# Patient Record
Sex: Female | Born: 1935 | Race: White | Hispanic: No | Smoking: Former smoker
Health system: Southern US, Community
[De-identification: ages and names within clinical notes are randomized; demographics above are authoritative.]

## PROBLEM LIST (undated history)

## (undated) DIAGNOSIS — I509 Heart failure, unspecified: Secondary | ICD-10-CM

## (undated) DIAGNOSIS — G459 Transient cerebral ischemic attack, unspecified: Secondary | ICD-10-CM

## (undated) DIAGNOSIS — I10 Essential (primary) hypertension: Secondary | ICD-10-CM

## (undated) DIAGNOSIS — F419 Anxiety disorder, unspecified: Secondary | ICD-10-CM

## (undated) DIAGNOSIS — E079 Disorder of thyroid, unspecified: Secondary | ICD-10-CM

## (undated) DIAGNOSIS — I4891 Unspecified atrial fibrillation: Secondary | ICD-10-CM

## (undated) HISTORY — PX: ABDOMINAL HYSTERECTOMY: SHX81

## (undated) HISTORY — PX: BACK SURGERY: SHX140

## (undated) HISTORY — PX: EYE SURGERY: SHX253

---

## 2014-04-26 DIAGNOSIS — Z5181 Encounter for therapeutic drug level monitoring: Secondary | ICD-10-CM | POA: Insufficient documentation

## 2014-04-26 DIAGNOSIS — G479 Sleep disorder, unspecified: Secondary | ICD-10-CM | POA: Insufficient documentation

## 2014-04-26 DIAGNOSIS — I4891 Unspecified atrial fibrillation: Secondary | ICD-10-CM | POA: Insufficient documentation

## 2014-04-26 DIAGNOSIS — Z7901 Long term (current) use of anticoagulants: Secondary | ICD-10-CM | POA: Insufficient documentation

## 2014-04-26 DIAGNOSIS — Z8673 Personal history of transient ischemic attack (TIA), and cerebral infarction without residual deficits: Secondary | ICD-10-CM | POA: Insufficient documentation

## 2014-04-26 DIAGNOSIS — E034 Atrophy of thyroid (acquired): Secondary | ICD-10-CM | POA: Insufficient documentation

## 2014-04-26 DIAGNOSIS — I10 Essential (primary) hypertension: Secondary | ICD-10-CM | POA: Insufficient documentation

## 2017-05-09 ENCOUNTER — Emergency Department: Payer: Medicare Other

## 2017-05-09 ENCOUNTER — Emergency Department
Admission: EM | Admit: 2017-05-09 | Discharge: 2017-05-09 | Disposition: A | Discharge status: Skilled Nursing Facility | Payer: Medicare Other | Attending: Emergency Medicine | Admitting: Emergency Medicine

## 2017-05-09 ENCOUNTER — Other Ambulatory Visit: Payer: Self-pay

## 2017-05-09 DIAGNOSIS — N390 Urinary tract infection, site not specified: Secondary | ICD-10-CM

## 2017-05-09 DIAGNOSIS — M79605 Pain in left leg: Secondary | ICD-10-CM

## 2017-05-09 DIAGNOSIS — M79604 Pain in right leg: Secondary | ICD-10-CM | POA: Diagnosis not present

## 2017-05-09 DIAGNOSIS — L97929 Non-pressure chronic ulcer of unspecified part of left lower leg with unspecified severity: Secondary | ICD-10-CM | POA: Insufficient documentation

## 2017-05-09 DIAGNOSIS — L97919 Non-pressure chronic ulcer of unspecified part of right lower leg with unspecified severity: Secondary | ICD-10-CM | POA: Insufficient documentation

## 2017-05-09 LAB — URINALYSIS, COMPLETE (UACMP) WITH MICROSCOPIC
BILIRUBIN URINE: NEGATIVE
Bacteria, UA: NONE SEEN
Glucose, UA: NEGATIVE mg/dL
Hgb urine dipstick: NEGATIVE
Ketones, ur: NEGATIVE mg/dL
Nitrite: NEGATIVE
PROTEIN: NEGATIVE mg/dL
SPECIFIC GRAVITY, URINE: 1.019 (ref 1.005–1.030)
pH: 5 (ref 5.0–8.0)

## 2017-05-09 LAB — COMPREHENSIVE METABOLIC PANEL
ALBUMIN: 3.2 g/dL — AB (ref 3.5–5.0)
ALK PHOS: 63 U/L (ref 38–126)
ALT: 19 U/L (ref 14–54)
AST: 33 U/L (ref 15–41)
Anion gap: 6 (ref 5–15)
BILIRUBIN TOTAL: 0.5 mg/dL (ref 0.3–1.2)
BUN: 24 mg/dL — AB (ref 6–20)
CALCIUM: 8.4 mg/dL — AB (ref 8.9–10.3)
CO2: 27 mmol/L (ref 22–32)
Chloride: 107 mmol/L (ref 101–111)
Creatinine, Ser: 0.96 mg/dL (ref 0.44–1.00)
GFR calc Af Amer: >60 mL/min (ref >60)
GFR calc non Af Amer: 54 mL/min — ABNORMAL LOW (ref >60)
GLUCOSE: 94 mg/dL (ref 65–99)
POTASSIUM: 3.2 mmol/L — AB (ref 3.5–5.1)
Sodium: 140 mmol/L (ref 135–145)
TOTAL PROTEIN: 6.4 g/dL — AB (ref 6.5–8.1)

## 2017-05-09 LAB — CBC WITH DIFFERENTIAL/PLATELET
BASOS ABS: 0 10*3/uL (ref 0–0.1)
BASOS PCT: 1 %
Eosinophils Absolute: 0.2 10*3/uL (ref 0–0.7)
Eosinophils Relative: 4 %
HEMATOCRIT: 30.9 % — AB (ref 35.0–47.0)
Hemoglobin: 10.5 g/dL — ABNORMAL LOW (ref 12.0–16.0)
Lymphocytes Relative: 31 %
Lymphs Abs: 1.6 10*3/uL (ref 1.0–3.6)
MCH: 30 pg (ref 26.0–34.0)
MCHC: 34 g/dL (ref 32.0–36.0)
MCV: 88.3 fL (ref 80.0–100.0)
Monocytes Absolute: 0.6 10*3/uL (ref 0.2–0.9)
Monocytes Relative: 12 %
NEUTROS ABS: 2.6 10*3/uL (ref 1.4–6.5)
Neutrophils Relative %: 52 %
Platelets: 189 10*3/uL (ref 150–440)
RBC: 3.5 MIL/uL — ABNORMAL LOW (ref 3.80–5.20)
RDW: 17.1 % — ABNORMAL HIGH (ref 11.5–14.5)
WBC: 5.1 10*3/uL (ref 3.6–11.0)

## 2017-05-09 MED ORDER — ACETAMINOPHEN 500 MG PO TABS
ORAL_TABLET | ORAL | Status: AC
  Filled 2017-05-09: qty 1

## 2017-05-09 MED ORDER — BACITRACIN ZINC 500 UNIT/GM EX OINT
TOPICAL_OINTMENT | CUTANEOUS | Status: AC
  Filled 2017-05-09: qty 0.9

## 2017-05-09 MED ORDER — ACETAMINOPHEN 325 MG PO TABS
650.0000 mg | ORAL_TABLET | Freq: Once | ORAL | Status: AC
  Administered 2017-05-09: 650 mg via ORAL

## 2017-05-09 MED ORDER — KETOROLAC TROMETHAMINE 30 MG/ML IJ SOLN
INTRAMUSCULAR | Status: AC
  Administered 2017-05-09: 9.9 mg via INTRAVENOUS
  Filled 2017-05-09: qty 1

## 2017-05-09 MED ORDER — FOSFOMYCIN TROMETHAMINE 3 G PO PACK
3.0000 g | PACK | Freq: Once | ORAL | Status: AC
  Administered 2017-05-09: 3 g via ORAL
  Filled 2017-05-09: qty 3

## 2017-05-09 MED ORDER — ACETAMINOPHEN 325 MG PO TABS
ORAL_TABLET | ORAL | Status: AC
  Administered 2017-05-09: 650 mg via ORAL
  Filled 2017-05-09: qty 2

## 2017-05-09 MED ORDER — TRAMADOL HCL 50 MG PO TABS: 50.0000 mg | ORAL_TABLET | Freq: Four times a day (QID) | ORAL | 0 refills | Status: AC | PRN

## 2017-05-09 MED ORDER — TRAMADOL HCL 50 MG PO TABS
50.0000 mg | ORAL_TABLET | Freq: Once | ORAL | Status: AC
  Administered 2017-05-09: 50 mg via ORAL
  Filled 2017-05-09: qty 1

## 2017-05-09 MED ORDER — ACETAMINOPHEN 500 MG PO TABS
500.0000 mg | ORAL_TABLET | Freq: Once | ORAL | Status: AC
  Administered 2017-05-09: 500 mg via ORAL

## 2017-05-09 MED ORDER — KETOROLAC TROMETHAMINE 30 MG/ML IJ SOLN
10.0000 mg | Freq: Once | INTRAMUSCULAR | Status: AC
  Administered 2017-05-09: 9.9 mg via INTRAVENOUS

## 2017-05-09 MED ORDER — TRAMADOL HCL 50 MG PO TABS: 50.0000 mg | ORAL_TABLET | Freq: Once | ORAL | Status: DC

## 2017-05-09 NOTE — Discharge Instructions (Addendum)
1.  You may take Tylenol as needed for pain.  You may take Tramadol (#10) as needed for more severe pain. 2.  Return to the ER for worsening symptoms, persistent vomiting, difficulty breathing or other concerns.

## 2017-05-09 NOTE — ED Notes (Signed)
Pt unable to sign due to hx of dementia . PT back to [facility] via [facility]. PT in NAD, VSS at time of departure

## 2017-05-09 NOTE — Progress Notes (Signed)
   05/09/17 0531  Clinical Encounter Type  Visited With Patient  Visit Type Follow-up  Spiritual Encounters  Spiritual Needs Emotional   While on unit, chaplain encountered patient in hallway.  She spoke of her pain and waiting to see doctor.  Patient expressed thoughts regarding return to facility.

## 2017-05-09 NOTE — ED Notes (Signed)
Pt seen resting on stretcher, pt stops every person that walks by her.  Pt informed she would be going back to [facility] does not have transportation at this time, pt will go back by EMS.

## 2017-05-09 NOTE — ED Triage Notes (Signed)
Pt from [facility] with bilateral leg pain from ulcers to bilateral lower legs. Ulcers are currently wrapped. Pt denies fever. Pt states the pain from ulcers woke her from sleep tonight, pt states has had the ulcers for one month.

## 2017-05-09 NOTE — ED Notes (Signed)
Patient transported to Ultrasound 

## 2017-05-09 NOTE — ED Notes (Signed)
ED Provider at bedside. 

## 2017-05-09 NOTE — Progress Notes (Signed)
   05/09/17 0306  Clinical Encounter Type  Visited With Patient;Health care provider  Visit Type Initial  Referral From Nurse  Consult/Referral To Chaplain  Spiritual Encounters  Spiritual Needs Emotional;Prayer   Chaplain responded to page for patient support.  Patient nurse said that patient was lonely.  Chaplain engaged patient who shared thoughts and feelings about how fear has been a large part of her life for the last thirty years.  She believes it began after the deaths of her son and her husband.  Patient spoke of daughter with whom she had lived before moving to facility.  Patient reported that she had just been at the facility a day when she came to hospital.  Patient expressed concern several times that facility didn't know where she was and would worry.  Chaplain checked in with staff regarding patient concerns and relayed to patient that facility was aware of her location and that was one thing she didn't need to worry about.  Conversation around [employer] and attempting to manage it, discussion on how faith can play a role.  Chaplain and patient prayed together and chaplain let patient know about chaplain support available.

## 2017-05-09 NOTE — ED Notes (Signed)
Patient given a warm blanket at this time.  

## 2017-05-09 NOTE — ED Notes (Signed)
Bacitracin, xeroform and gauze dressings applied to bilateral lower extremity wounds per Dr. Dolores Frame.

## 2017-05-09 NOTE — ED Notes (Signed)
chaplin at bedside.   

## 2017-05-09 NOTE — ED Provider Notes (Signed)
[facility] Emergency Department Provider Note   ____________________________________________   First MD Initiated Contact with Patient 05/09/17 [phone]     (approximate)  I have reviewed the triage vital signs and the nursing notes.   HISTORY  Chief Complaint Leg Pain    HPI Lydia Buchanan is a 84 y.o. female brought to the ED from [facility] via EMS with a chief complaint of bilateral leg pain.  Patient has a history of venous stasis ulcers, currently being treated at the wound clinic.  She just moved to [facility] yesterday from living with her daughter.  Reports awakening with bilateral leg pain due to the ulcers.  States she has had the ulcers for the past month and has continuous pain.  She tried to stand tonight to use the restroom and noted exacerbation of pain.  Denies associated fever, chills, chest pain, shortness of breath, abdominal pain, nausea or vomiting.  Denies recent travel or trauma.  Past medical history Leg ulcers   There are no active problems to display for this patient.   Prior to Admission medications   Not on File    Allergies Macrobid [nitrofurantoin macrocrystal] and Oxycodone  No family history on file.  Social History Social History   Tobacco Use  . Smoking status: Not on file  Substance Use Topics  . Alcohol use: Not on file  . Drug use: Not on file    Review of Systems  Constitutional: No fever/chills. Eyes: No visual changes. ENT: No sore throat. Cardiovascular: Denies chest pain. Respiratory: Denies shortness of breath. Gastrointestinal: No abdominal pain.  No nausea, no vomiting.  No diarrhea.  No constipation. Genitourinary: Negative for dysuria. Musculoskeletal: Positive for bilateral lower leg pain.  Negative for back pain. Skin: Negative for rash. Neurological: Negative for headaches, focal weakness or numbness.   ____________________________________________   PHYSICAL EXAM:  VITAL  SIGNS: ED Triage Vitals [05/09/17 0106]  Enc Vitals Group     BP 133/72     Pulse Rate 88     Resp 14     Temp 98.4 F (36.9 C)     Temp Source Oral     SpO2 97 %     Weight 112 lb (50.8 kg)     Height  (1.575 m)     Head Circumference      Peak Flow      Pain Score 10     Pain Loc      Pain Edu?      Excl. in GC?     Constitutional: Alert and oriented.  Frail appearing and in no acute distress. Eyes: Conjunctivae are normal. PERRL. EOMI. Head: Atraumatic. Nose: No congestion/rhinnorhea. Mouth/Throat: Mucous membranes are moist.  Oropharynx non-erythematous. Neck: No stridor.   Cardiovascular: Normal rate, regular rhythm. Grossly normal heart sounds.  Good peripheral circulation. Respiratory: Normal respiratory effort.  No retractions. Lungs CTAB. Gastrointestinal: Soft and nontender. No distention. No abdominal bruits. No CVA tenderness. Musculoskeletal: Bilateral lower extremities with small open ulcers which do not appear to be infected.  There is no surrounding warmth or erythema.  There is a larger 1 cm in diameter ulcer to the right posterior calf which is painful for the patient.  No calf swelling bilaterally.  Palpable distal pulses.  Brisk, less than 5-second capillary refill.  Symmetrically warm limbs without evidence for ischemia.  No joint effusions. Neurologic:  Normal speech and language. No gross focal neurologic deficits are appreciated. MAEx4. Skin:  Skin is  warm, dry and intact. No rash noted. Psychiatric: Mood and affect are normal. Speech and behavior are normal.  ____________________________________________   LABS (all labs ordered are listed, but only abnormal results are displayed)  Labs Reviewed  CBC WITH DIFFERENTIAL/PLATELET - Abnormal; Notable for the following components:      Result Value   RBC 3.50 (*)    Hemoglobin 10.5 (*)    HCT 30.9 (*)    RDW 17.1 (*)    All other components within normal limits  COMPREHENSIVE METABOLIC PANEL -  Abnormal; Notable for the following components:   Potassium 3.2 (*)    BUN 24 (*)    Calcium 8.4 (*)    Total Protein 6.4 (*)    Albumin 3.2 (*)    GFR calc non Af Amer 54 (*)    All other components within normal limits  URINALYSIS, COMPLETE (UACMP) WITH MICROSCOPIC - Abnormal; Notable for the following components:   Color, Urine YELLOW (*)    APPearance HAZY (*)    Leukocytes, UA TRACE (*)    All other components within normal limits   ____________________________________________  EKG  None ____________________________________________  RADIOLOGY  ED MD interpretation: No DVT  Official radiology report(s): US Venous Img Lower Bilateral  Result Date: 05/09/2017 CLINICAL DATA:  Bilateral leg pain with ulcers x1 month. EXAM: BILATERAL LOWER EXTREMITY VENOUS DOPPLER ULTRASOUND TECHNIQUE: Gray-scale sonography with graded compression, as well as color Doppler and duplex ultrasound were performed to evaluate the lower extremity deep venous systems from the level of the common femoral vein and including the common femoral, femoral, profunda femoral, popliteal and calf veins including the posterior tibial, peroneal and gastrocnemius veins when visible. The superficial great saphenous vein was also interrogated. Spectral Doppler was utilized to evaluate flow at rest and with distal augmentation maneuvers in the common femoral, femoral and popliteal veins. COMPARISON:  None. FINDINGS: RIGHT LOWER EXTREMITY Common Femoral Vein: No evidence of thrombus. Normal compressibility, respiratory phasicity and response to augmentation. Saphenofemoral Junction: No evidence of thrombus. Normal compressibility and flow on color Doppler imaging. Profunda Femoral Vein: No evidence of thrombus. Normal compressibility and flow on color Doppler imaging. Femoral Vein: No evidence of thrombus. Normal compressibility, respiratory phasicity and response to augmentation. Popliteal Vein: No evidence of thrombus. Normal  compressibility, respiratory phasicity and response to augmentation. Calf Veins: No evidence of thrombus. Normal compressibility and flow on color Doppler imaging. Superficial Great Saphenous Vein: No evidence of thrombus. Normal compressibility. Venous Reflux:  None. Other Findings:  None. LEFT LOWER EXTREMITY Common Femoral Vein: No evidence of thrombus. Normal compressibility, respiratory phasicity and response to augmentation. Saphenofemoral Junction: No evidence of thrombus. Normal compressibility and flow on color Doppler imaging. Profunda Femoral Vein: No evidence of thrombus. Normal compressibility and flow on color Doppler imaging. Femoral Vein: No evidence of thrombus. Normal compressibility, respiratory phasicity and response to augmentation. Popliteal Vein: No evidence of thrombus. Normal compressibility, respiratory phasicity and response to augmentation. Calf Veins: No evidence of thrombus. Normal compressibility and flow on color Doppler imaging. Superficial Great Saphenous Vein: No evidence of thrombus. Normal compressibility. Venous Reflux:  None. Other Findings:  None. IMPRESSION: No evidence of deep venous thrombosis. Electronically Signed   By: Tollie Eth M.D.   On: 05/09/2017 03:12    ____________________________________________   PROCEDURES  Procedure(s) performed: None  Procedures  Critical Care performed: No  ____________________________________________   INITIAL IMPRESSION / ASSESSMENT AND PLAN / ED COURSE  As part of my medical decision making, I reviewed  the following data within the electronic MEDICAL RECORD NUMBER Nursing notes reviewed and incorporated, Labs reviewed, Old chart reviewed and Notes from prior ED visits   84 year old female with bilateral lower extremity ulcers who presents with pain.  Differential diagnosis includes but is not limited to ulcer pain, DVT, osteomyelitis, musculoskeletal, etc.   Will obtain basic lab work, Doppler ultrasound to evaluate  for DVT.  Tylenol and low-dose IV Toradol for pain.  Clinical Course as of May 09 701  Sun May 09, 2017  0332 Pain down to 6/10.  Patient was able to ambulate well to the commode.  Updated her of test results.  Will give 1 dose of fosfomycin in the emergency department.  Will discharge back to facility and patient will follow-up with her wound care doctor as scheduled.  Strict return precautions given.  Patient verbalizes understanding agrees with plan of care.   [JS]  [phone] Patient awaiting transport back to [facility].  Evidently there is no one to answer their phones until 7:30 AM.  I have written a limited prescription for tramadol for patient's pain.     [JS]    Clinical Course User Index [JS] Irean Hong, MD     ____________________________________________   FINAL CLINICAL IMPRESSION(S) / ED DIAGNOSES  Final diagnoses:  Bilateral leg pain  Ulcers of both lower legs (HCC)  Urinary tract infection without hematuria, site unspecified     ED Discharge Orders    None       Note:  This document was prepared using Dragon voice recognition software and may include unintentional dictation errors.    Irean Hong, MD 05/09/17 [phone]

## 2017-05-09 NOTE — ED Notes (Signed)
Patient assisted to the bathroom with minimal assistance.

## 2017-05-11 ENCOUNTER — Emergency Department
Admission: EM | Admit: 2017-05-11 | Discharge: 2017-05-11 | Disposition: A | Discharge status: Home / Self Care | Payer: Medicare Other | Attending: Emergency Medicine | Admitting: Emergency Medicine

## 2017-05-11 ENCOUNTER — Encounter: Payer: Self-pay | Admitting: Emergency Medicine

## 2017-05-11 ENCOUNTER — Emergency Department: Payer: Medicare Other

## 2017-05-11 DIAGNOSIS — F411 Generalized anxiety disorder: Secondary | ICD-10-CM

## 2017-05-11 DIAGNOSIS — R0602 Shortness of breath: Secondary | ICD-10-CM | POA: Diagnosis not present

## 2017-05-11 DIAGNOSIS — Z87891 Personal history of nicotine dependence: Secondary | ICD-10-CM | POA: Insufficient documentation

## 2017-05-11 DIAGNOSIS — F410 Panic disorder [episodic paroxysmal anxiety] without agoraphobia: Secondary | ICD-10-CM

## 2017-05-11 DIAGNOSIS — I509 Heart failure, unspecified: Secondary | ICD-10-CM | POA: Diagnosis not present

## 2017-05-11 DIAGNOSIS — F419 Anxiety disorder, unspecified: Secondary | ICD-10-CM | POA: Insufficient documentation

## 2017-05-11 HISTORY — DX: Heart failure, unspecified: I50.9

## 2017-05-11 HISTORY — DX: Anxiety disorder, unspecified: F41.9

## 2017-05-11 LAB — CBC
HCT: 32.2 % — ABNORMAL LOW (ref 35.0–47.0)
HEMOGLOBIN: 10.6 g/dL — AB (ref 12.0–16.0)
MCH: 29.6 pg (ref 26.0–34.0)
MCHC: 33.1 g/dL (ref 32.0–36.0)
MCV: 89.4 fL (ref 80.0–100.0)
Platelets: 171 10*3/uL (ref 150–440)
RBC: 3.6 MIL/uL — AB (ref 3.80–5.20)
RDW: 16.6 % — ABNORMAL HIGH (ref 11.5–14.5)
WBC: 4.4 10*3/uL (ref 3.6–11.0)

## 2017-05-11 LAB — BASIC METABOLIC PANEL
ANION GAP: 5 (ref 5–15)
BUN: 20 mg/dL (ref 6–20)
CALCIUM: 8.3 mg/dL — AB (ref 8.9–10.3)
CO2: 29 mmol/L (ref 22–32)
Chloride: 107 mmol/L (ref 101–111)
Creatinine, Ser: 0.8 mg/dL (ref 0.44–1.00)
GLUCOSE: 90 mg/dL (ref 65–99)
Potassium: 2.8 mmol/L — ABNORMAL LOW (ref 3.5–5.1)
Sodium: 141 mmol/L (ref 135–145)

## 2017-05-11 LAB — BRAIN NATRIURETIC PEPTIDE: B Natriuretic Peptide: 335 pg/mL — ABNORMAL HIGH (ref 0.0–100.0)

## 2017-05-11 LAB — TROPONIN I: Troponin I: <0.03 ng/mL (ref <0.03)

## 2017-05-11 MED ORDER — FUROSEMIDE 10 MG/ML IJ SOLN
40.0000 mg | Freq: Once | INTRAMUSCULAR | Status: AC
  Administered 2017-05-11: 40 mg via INTRAVENOUS
  Filled 2017-05-11: qty 4

## 2017-05-11 MED ORDER — CARVEDILOL 6.25 MG PO TABS: 12.5000 mg | ORAL_TABLET | Freq: Two times a day (BID) | ORAL | Status: DC

## 2017-05-11 MED ORDER — POTASSIUM CHLORIDE 20 MEQ PO PACK
40.0000 meq | PACK | Freq: Once | ORAL | Status: AC
  Administered 2017-05-11: 40 meq via ORAL
  Filled 2017-05-11: qty 2

## 2017-05-11 MED ORDER — MAGNESIUM SULFATE 2 GM/50ML IV SOLN
2.0000 g | Freq: Once | INTRAVENOUS | Status: AC
  Administered 2017-05-11: 2 g via INTRAVENOUS
  Filled 2017-05-11: qty 50

## 2017-05-11 MED ORDER — POTASSIUM CHLORIDE CRYS ER 20 MEQ PO TBCR: 40.0000 meq | EXTENDED_RELEASE_TABLET | Freq: Once | ORAL | Status: DC

## 2017-05-11 MED ORDER — LOSARTAN POTASSIUM 50 MG PO TABS
25.0000 mg | ORAL_TABLET | Freq: Once | ORAL | Status: AC
  Administered 2017-05-11: 25 mg via ORAL
  Filled 2017-05-11: qty 1

## 2017-05-11 MED ORDER — CARVEDILOL 6.25 MG PO TABS
12.5000 mg | ORAL_TABLET | Freq: Once | ORAL | Status: AC
  Administered 2017-05-11: 12.5 mg via ORAL
  Filled 2017-05-11: qty 2

## 2017-05-11 MED ORDER — IPRATROPIUM-ALBUTEROL 0.5-2.5 (3) MG/3ML IN SOLN
3.0000 mL | Freq: Once | RESPIRATORY_TRACT | Status: AC
  Administered 2017-05-11: 3 mL via RESPIRATORY_TRACT
  Filled 2017-05-11: qty 3

## 2017-05-11 MED ORDER — POTASSIUM CHLORIDE 10 MEQ/100ML IV SOLN
10.0000 meq | Freq: Once | INTRAVENOUS | Status: AC
  Administered 2017-05-11: 10 meq via INTRAVENOUS
  Filled 2017-05-11: qty 100

## 2017-05-11 NOTE — ED Notes (Signed)
Pt discharged to POV using walker with slow steady gait. VSS. NAD. Wounds redressed. Daughter with pt during discharge. All questions answered.

## 2017-05-11 NOTE — Consult Note (Signed)
[facility] Consult   Reason for Consult: Consult for 84 year old woman who came to the emergency room with acute leg pain but seems to be suffering more from anxiety issues Referring Physician: Alfred Levins Patient Identification: Lydia Buchanan MRN:  [id number] Principal Diagnosis: Generalized anxiety disorder Diagnosis:   Patient Active Problem List   Diagnosis Date Noted  . Generalized anxiety disorder [F41.1] 05/11/2017  . Panic attacks [F41.0] 05/11/2017    Total Time spent with patient: 1 hour  Subjective:   Lydia Buchanan is a 84 y.o. female patient admitted with "I am afraid of everything".  HPI: Patient interviewed chart reviewed.  Spoke with emergency room doctor.  Spoke with patient's daughter.  This is an 89 year old woman who came from [facility] where she has only been living for the last couple days.  The acute complaint was of pain in her legs but it appears that the larger issue is that the patient is getting panicky every night when she wakes up.  Patient tells me very clearly that she has had anxiety for many years probably for decades.  She feels afraid of things all the time.  She does not like being by herself and panics at night when she wakes up alone.  The patient had been living with her daughter and her daughter's family until the last couple days when she moved into [facility] assisted living.  Patient has had a difficult time adjusting because of being lonely at night.  When she wakes up and feels any somatic symptom including the chronic pain in her legs she gets overwhelmed and calls her daughter or calls 911.  Patient is receiving appropriate medication care for her anxiety taking Lexapro recently increased to 20 mg, buspirone 15 mg 3 times a day and clonazepam 0.25 mg at night but so far without significant improvement.  No suicidal thoughts not depressed no sign of real dementia.  Social history: Patient is a widow.  2 adult children.  Had been  living with her daughter in another community just in the last couple days moved into [facility] which apparently will be her first assisted living environment  Medical history: Several chronic ulcers on the legs requiring wound care with chronic pain, congestive heart failure  Substance abuse history: Patient reports that many years ago she was prescribed alprazolam for her anxiety but got to the point where she was overusing.  Past Psychiatric History: Long-standing anxiety problems going back probably at least 30 years.  No history of psychiatric hospitalization no history of suicide attempt no history of psychosis or violence.  Currently on Lexapro BuSpar and low-dose clonazepam prescribed by primary care doctor.  Risk to Self: Is patient at risk for suicide?: No Risk to Others:   Prior Inpatient Therapy:   Prior Outpatient Therapy:    Past Medical History:  Past Medical History:  Diagnosis Date  . Anxiety   . CHF (congestive heart failure) ([place])     Past Surgical History:  Procedure Laterality Date  . BACK SURGERY     Family History: History reviewed. No pertinent family history. Family Psychiatric  History: None Social History:  Social History   Substance and Sexual Activity  Alcohol Use Not Currently     Social History   Substance and Sexual Activity  Drug Use Not on file    Social History   Socioeconomic History  . Marital status: Widowed    Spouse name: Not on file  . Number of children: Not on file  .  Years of education: Not on file  . Highest education level: Not on file  Occupational History  . Not on file  Social Needs  . Financial resource strain: Not on file  . Food insecurity:    Worry: Not on file    Inability: Not on file  . Transportation needs:    Medical: Not on file    Non-medical: Not on file  Tobacco Use  . Smoking status: Former Research scientist (life sciences)  . Smokeless tobacco: Never Used  Substance and Sexual Activity  . Alcohol use: Not Currently  .  Drug use: Not on file  . Sexual activity: Not on file  Lifestyle  . Physical activity:    Days per week: Not on file    Minutes per session: Not on file  . Stress: Not on file  Relationships  . Social connections:    Talks on phone: Not on file    Gets together: Not on file    Attends religious service: Not on file    Active member of club or organization: Not on file    Attends meetings of clubs or organizations: Not on file    Relationship status: Not on file  Other Topics Concern  . Not on file  Social History Narrative  . Not on file   Additional Social History:    Allergies:   Allergies  Allergen Reactions  . Macrobid [Nitrofurantoin Macrocrystal]   . Oxycodone     Hallucinations per pt    Labs:  Results for orders placed or performed during the Buchanan encounter of 05/11/17 (from the past 48 hour(s))  Basic metabolic panel     Status: Abnormal   Collection Time: 05/11/17  6:41 AM  Result Value Ref Range   Sodium 141 135 - 145 mmol/L   Potassium 2.8 (L) 3.5 - 5.1 mmol/L   Chloride 107 101 - 111 mmol/L   CO2 29 22 - 32 mmol/L   Glucose, Bld 90 65 - 99 mg/dL   BUN 20 6 - 20 mg/dL   Creatinine, Ser 0.80 0.44 - 1.00 mg/dL   Calcium 8.3 (L) 8.9 - 10.3 mg/dL   GFR calc non Af Amer >60 >60 mL/min   GFR calc Af Amer >60 >60 mL/min    Comment: (NOTE) The eGFR has been calculated using the CKD EPI equation. This calculation has not been validated in all clinical situations. eGFR's persistently <60 mL/min signify possible Chronic Kidney Disease.    Anion gap 5 5 - 15    Comment: Performed at [facility], [address] [place], [place] [address]  CBC     Status: Abnormal   Collection Time: 05/11/17  6:41 AM  Result Value Ref Range   WBC 4.4 3.6 - 11.0 K/uL   RBC 3.60 (L) 3.80 - 5.20 MIL/uL   Hemoglobin 10.6 (L) 12.0 - 16.0 g/dL   HCT 32.2 (L) 35.0 - 47.0 %   MCV 89.4 80.0 - 100.0 fL   MCH 29.6 26.0 - 34.0 pg   MCHC 33.1 32.0 - 36.0 g/dL   RDW  16.6 (H) 11.5 - 14.5 %   Platelets 171 150 - 440 K/uL    Comment: Performed at [facility], [address] [place], [place] [address]  Troponin I     Status: None   Collection Time: 05/11/17  6:41 AM  Result Value Ref Range   Troponin I <0.03 <0.03 ng/mL    Comment: Performed at [facility], [address],  [place], [place] [address]  Brain natriuretic peptide     Status: Abnormal   Collection Time: 05/11/17  6:41 AM  Result Value Ref Range   B Natriuretic Peptide 335.0 (H) 0.0 - 100.0 pg/mL    Comment: Performed at [facility], [address] [place],  [address]    No current facility-administered medications for this encounter.    Current Outpatient Medications  Medication Sig Dispense Refill  . benzonatate (TESSALON) 100 MG capsule Take 100 mg by mouth 3 (three) times daily as needed for cough.  1  . carvedilol (COREG) 12.5 MG tablet Take 12.5 mg by mouth 2 (two) times daily.  0  . clonazePAM (KLONOPIN) 0.5 MG tablet Take 0.25 mg by mouth at bedtime as needed for anxiety.  0  . escitalopram (LEXAPRO) 20 MG tablet Take 20 mg by mouth daily.  1  . furosemide (LASIX) 40 MG tablet Take 40 mg by mouth daily.  5  . KLOR-CON M20 20 MEQ tablet Take 20 mEq by mouth every other day.  3  . levothyroxine (SYNTHROID, LEVOTHROID) 100 MCG tablet Take 100 mcg by mouth daily.  3  . losartan (COZAAR) 25 MG tablet Take 25 mg by mouth daily.  0  . rosuvastatin (CRESTOR) 20 MG tablet Take 20 mg by mouth daily.  0  . tamsulosin (FLOMAX) 0.4 MG CAPS capsule Take 0.4 mg by mouth daily.  3  . traMADol (ULTRAM) 50 MG tablet Take 1 tablet (50 mg total) by mouth every 6 (six) hours as needed. 10 tablet 0    Musculoskeletal: Strength & Muscle Tone: within normal limits Gait & Station: normal Patient leans: N/A  Psychiatric Specialty Exam: Physical Exam  Nursing note and vitals reviewed. Constitutional: She appears well-developed and well-nourished.  HENT:    Head: Normocephalic and atraumatic.  Eyes: Pupils are equal, round, and reactive to light. Conjunctivae are normal.  Neck: Normal range of motion.  Cardiovascular: Normal heart sounds.  Respiratory: Effort normal.  GI: Soft.  Musculoskeletal: Normal range of motion.  Neurological: She is alert.  Skin: Skin is warm and dry.     Psychiatric: Her speech is normal and behavior is normal. Judgment and thought content normal. Her mood appears anxious. Cognition and memory are normal.    Review of Systems  Constitutional: Negative.   HENT: Negative.   Eyes: Negative.   Respiratory: Negative.   Cardiovascular: Negative.   Gastrointestinal: Negative.   Musculoskeletal: Negative.   Skin: Negative.   Neurological: Negative.   Psychiatric/Behavioral: Negative for depression, hallucinations, memory loss, substance abuse and suicidal ideas. The patient is nervous/anxious and has insomnia.     Blood pressure (!) 161/102, pulse (!) 104, temperature 98.3 F (36.8 C), temperature source Oral, resp. rate (!) 21, weight 50.8 kg (112 lb), SpO2 94 %.Body mass index is 20.49 kg/m.  General Appearance: Fairly Groomed  Eye Contact:  Good  Speech:  Clear and Coherent  Volume:  Normal  Mood:  Anxious  Affect:  Congruent  Thought Process:  Coherent  Orientation:  Full (Time, Place, and Person)  Thought Content:  Logical  Suicidal Thoughts:  No  Homicidal Thoughts:  No  Memory:  Immediate;   Fair Recent;   Fair Remote;   Fair  Judgement:  Fair  Insight:  Fair  Psychomotor Activity:  Normal  Concentration:  Concentration: Fair  Recall:  [employer] of Knowledge:  Fair  Language:  Fair  Akathisia:  No  Handed:  Right  AIMS (if  indicated):     Assets:  Communication Skills Desire for Improvement Financial Resources/Insurance Housing Resilience Social Support  ADL's:  Intact  Cognition:  WNL  Sleep:        Treatment Plan Summary: Plan 84 year old woman with chronic anxiety symptoms  which have become more problematic lately as she has been compelled to live independently.  Having a very difficult time transitioning to assisted living despite what sounds like efforts by the staff at [facility] to be accommodating.  Reviewing her medication everything about it appears to be appropriate.  I would not have any change to recommend to her medicine.  I would advise against increasing dose of benzodiazepine at night as she is likely to not get much benefit from it and it will just increase falls risk.  The patient currently has minimal to no memory problems and is not demented and adding benzodiazepines could make that worse.  I advised the patient and the daughter that psychotherapy particularly cognitive behavioral therapy would be the most effective thing for this situation.  Case reviewed as well with emergency room doctor.  I have written out the names of 3 local therapist although I have warned the patient and family that resources are scarce and [facility].  No change to medicine.  No need for hospitalization.  Disposition: No evidence of imminent risk to self or others at present.   Patient does not meet criteria for psychiatric inpatient admission. Supportive therapy provided about ongoing stressors.  Alethia Berthold, MD 05/11/2017 2:33 PM

## 2017-05-11 NOTE — ED Triage Notes (Signed)
Pt arrived via EMS from [facility] with bilateral leg pain as well as SOB. Pt to have appointment this AM in [facility] for Ulcers to the lower legs.

## 2017-05-11 NOTE — ED Provider Notes (Addendum)
-----------------------------------------   8:07 AM on 05/11/2017 -----------------------------------------   Blood pressure (!) 197/92, pulse 88, temperature 98.3 F (36.8 C), temperature source Oral, resp. rate 19, weight 50.8 kg (112 lb), SpO2 96 %.  Assuming care from Dr. Zenda Alpers of [facility] is a 84 y.o. female with a chief complaint of Shortness of Breath and Leg Pain .    Please refer to H&P by previous MD for further details.  The current plan of care is to reassess after electrolyte repletion and duoneb. Patient here with SOB and leg pain. Seen yesterday and diagnosed with b/l leg ulcers. History of anxiety and SOB thought to be anxiety related by Dr. Zenda Alpers. Given K and Mag for slightly prolonged QTc. Currently on telemetry.     _________________________ 10:41 AM on 05/11/2017 -----------------------------------------  Patient's daughter Jaymes Graff called the emergency department to let me know that the family is requesting a psychiatric evaluation.  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  She usually is fine into her anxiety hits and then she starts complaining of severe pain everywhere.  She is on BuSpar, Lexapro, and Klonopin.  These medications do not seem to help.  I will consult psychiatry. Patient received her am meds and is medically cleared.   _________________________ 2:18 PM on 05/11/2017 -----------------------------------------  Patient seen by Dr. Toni Amend and cleared for dc.  Had an appointment with wound care in [place] today which she admits to.  She is about to establish care with her local wound care center next week.  I contacted the wound center as I was able to get her an appointment this week on Thursday 8 AM.  Patient is cleared for discharge.   Don Perking, [place], MD 05/11/17 [phone]    Don Perking [place], MD 05/11/17 [phone]

## 2017-05-11 NOTE — ED Provider Notes (Addendum)
[facility] Emergency Department Provider Note   ____________________________________________   First MD Initiated Contact with Patient 05/11/17 [phone]     (approximate)  I have reviewed the triage vital signs and the nursing notes.   HISTORY  Chief Complaint Shortness of Breath and Leg Pain    HPI Lydia Buchanan is a 84 y.o. female who comes into the hospital today with some shortness of breath.  The patient is from home and she woke up at 2 AM with trouble breathing.  EMS went out to check the patient then but she checked out well so she was told to go back to sleep.  The patient reports that she does have CHF and a history of anxiety.  Per EMS the patient may have some fluid in her lungs but they are not sure.  The patient reports that she is supposed to go to [place] today and her daughter kept telling her to go back to bed.  The patient's oxygen saturation was 98% on room air.  Yesterday she was here for some leg wounds and given a prescription but she did not fill it.  The patient states that she could not take the pain in her legs and she was feeling short of breath so she decided to come into the hospital.  The patient denies any chest pain or abdominal pain and she denies any nausea or vomiting.  Past Medical History:  Diagnosis Date  . Anxiety   . CHF (congestive heart failure) (HCC)     There are no active problems to display for this patient.   Past Surgical History:  Procedure Laterality Date  . BACK SURGERY      Prior to Admission medications   Medication Sig Start Date End Date Taking? Authorizing Provider  traMADol (ULTRAM) 50 MG tablet Take 1 tablet (50 mg total) by mouth every 6 (six) hours as needed. 05/09/17   Irean Hong, MD    Allergies Macrobid [nitrofurantoin macrocrystal] and Oxycodone  History reviewed. No pertinent family history.  Social History Social History   Tobacco Use  . Smoking status: Former Games developer  .  Smokeless tobacco: Never Used  Substance Use Topics  . Alcohol use: Not Currently  . Drug use: Not on file    Review of Systems  Constitutional: No fever/chills Eyes: No visual changes. ENT: No sore throat. Cardiovascular: Denies chest pain. Respiratory: shortness of breath. Gastrointestinal: No abdominal pain.  No nausea, no vomiting.  No diarrhea.  No constipation. Genitourinary: Negative for dysuria. Musculoskeletal: Negative for back pain. Skin: Negative for rash. Neurological: Negative for headaches, focal weakness or numbness.   ____________________________________________   PHYSICAL EXAM:  VITAL SIGNS: ED Triage Vitals  Enc Vitals Group     BP 05/11/17 0639 (!) 157/82     Pulse Rate 05/11/17 0639 82     Resp 05/11/17 0639 18     Temp 05/11/17 0639 98.3 F (36.8 C)     Temp Source 05/11/17 0639 Oral     SpO2 05/11/17 0639 98 %     Weight 05/11/17 0638 112 lb (50.8 kg)     Height --      Head Circumference --      Peak Flow --      Pain Score 05/11/17 0715 5     Pain Loc --      Pain Edu? --      Excl. in GC? --     Constitutional: Alert and oriented. Well appearing  and in moderate distress. Eyes: Conjunctivae are normal. PERRL. EOMI. Head: Atraumatic. Nose: No congestion/rhinnorhea. Mouth/Throat: Mucous membranes are moist.  Oropharynx non-erythematous. Cardiovascular: Normal rate, regular rhythm.  Mild systolic murmur.  Good peripheral circulation. Respiratory: Normal respiratory effort.  No retractions. Lungs CTAB. Gastrointestinal: Soft and nontender. No distention.  Positive bowel sounds Musculoskeletal: No lower extremity tenderness nor edema.  Neurologic:  Normal speech and language.  Skin:  Skin is warm, dry and intact.  Psychiatric: Mood and affect are normal.   ____________________________________________   LABS (all labs ordered are listed, but only abnormal results are displayed)  Labs Reviewed  BASIC METABOLIC PANEL - Abnormal; Notable  for the following components:      Result Value   Potassium 2.8 (*)    Calcium 8.3 (*)    All other components within normal limits  CBC - Abnormal; Notable for the following components:   RBC 3.60 (*)    Hemoglobin 10.6 (*)    HCT 32.2 (*)    RDW 16.6 (*)    All other components within normal limits  TROPONIN I  BRAIN NATRIURETIC PEPTIDE   ____________________________________________  EKG  ED ECG REPORT I, Rebecka Apley, the attending physician, personally viewed and interpreted this ECG.   Date: 05/11/2017  EKG Time: 0636  Rate: 90  Rhythm: atrial fibrillation, rate 90  Axis: normal  Intervals:none  ST&T Change: none  ____________________________________________  RADIOLOGY  ED MD interpretation:  CXR: Cardiomegaly, aortic atherosclerosis, lungs hyperexpanded with mild basilar scarring, No edema or consolidation.  Official radiology report(s): Dg Chest 2 View  Result Date: 05/11/2017 CLINICAL DATA:  Shortness of breath EXAM: CHEST - 2 VIEW COMPARISON:  None. FINDINGS: Lungs are hyperexpanded. There is slight scarring in the bases. There is no edema or consolidation. There is cardiomegaly with pulmonary vascularity within normal limits. There is aortic atherosclerosis. No adenopathy. There is degenerative change in the thoracic spine. IMPRESSION: Cardiomegaly.  Aortic atherosclerosis. Lungs hyperexpanded with mild bibasilar scarring. No edema or consolidation evident. Aortic Atherosclerosis (ICD10-I70.0). Electronically Signed   By: Bretta Bang III M.D.   On: 05/11/2017 07:18    ____________________________________________   PROCEDURES  Procedure(s) performed: None  Procedures  Critical Care performed: No  ____________________________________________   INITIAL IMPRESSION / ASSESSMENT AND PLAN / ED COURSE  As part of my medical decision making, I reviewed the following data within the electronic MEDICAL RECORD NUMBER Notes from prior ED visits and   Controlled Substance Database   This is an 84 year old female who comes into the hospital today with shortness of breath.  She has not had a cough or any fevers just says that she feels short of breath.  The patient is not having any work of breathing as well.  My differential diagnosis includes pneumonia, CHF exacerbation, COPD.  The patient had some blood work drawn to include a CBC, BMP and a troponin.  The patient's blood work is unremarkable.  We are waiting for the BNP to return.  The patient's chest x-ray showed some hyperinflation and cardiomegaly with no edema or lung consolidation.  I will get the patient a DuoNeb treatment and await the results of the BNP.  As the patient's symptoms started at 2 AM I will not repeat the troponin as one troponin should be appropriate..  The patient's care will be signed out to Dr. Don Perking who will follow up the results and disposition the patient.      ____________________________________________   FINAL CLINICAL IMPRESSION(S) / ED  DIAGNOSES  Final diagnoses:  Shortness of breath     ED Discharge Orders    None       Note:  This document was prepared using Dragon voice recognition software and may include unintentional dictation errors.    Rebecka Apley, MD 05/11/17 [id number]    Rebecka Apley, MD 05/11/17 [phone]

## 2017-05-13 ENCOUNTER — Encounter: Payer: Medicare Other | Attending: Nurse Practitioner | Admitting: Nurse Practitioner

## 2017-05-13 DIAGNOSIS — L97812 Non-pressure chronic ulcer of other part of right lower leg with fat layer exposed: Secondary | ICD-10-CM | POA: Diagnosis not present

## 2017-05-13 DIAGNOSIS — F419 Anxiety disorder, unspecified: Secondary | ICD-10-CM | POA: Diagnosis not present

## 2017-05-13 DIAGNOSIS — I509 Heart failure, unspecified: Secondary | ICD-10-CM | POA: Insufficient documentation

## 2017-05-13 DIAGNOSIS — I83028 Varicose veins of left lower extremity with ulcer other part of lower leg: Secondary | ICD-10-CM | POA: Diagnosis not present

## 2017-05-13 DIAGNOSIS — I4891 Unspecified atrial fibrillation: Secondary | ICD-10-CM | POA: Diagnosis not present

## 2017-05-13 DIAGNOSIS — G4730 Sleep apnea, unspecified: Secondary | ICD-10-CM | POA: Insufficient documentation

## 2017-05-13 DIAGNOSIS — L97212 Non-pressure chronic ulcer of right calf with fat layer exposed: Secondary | ICD-10-CM | POA: Diagnosis not present

## 2017-05-13 DIAGNOSIS — L97822 Non-pressure chronic ulcer of other part of left lower leg with fat layer exposed: Secondary | ICD-10-CM | POA: Diagnosis not present

## 2017-05-13 DIAGNOSIS — Z87891 Personal history of nicotine dependence: Secondary | ICD-10-CM | POA: Diagnosis not present

## 2017-05-13 DIAGNOSIS — I83018 Varicose veins of right lower extremity with ulcer other part of lower leg: Secondary | ICD-10-CM | POA: Insufficient documentation

## 2017-05-13 DIAGNOSIS — I110 Hypertensive heart disease with heart failure: Secondary | ICD-10-CM | POA: Diagnosis not present

## 2017-05-16 NOTE — Progress Notes (Signed)
Lydia Buchanan, Lydia Buchanan ([id number]) Visit Report for 05/13/2017 Abuse/Suicide Risk Screen Details Patient Name: Lydia Buchanan, Lydia Buchanan Date of Service: 05/13/2017 8:00 AM Medical Record Number: [id number] Patient Account Number: [id number] Date of Birth/Sex: 03-05-1932 (84 y.o. Female) Treating RN: Curtis Sites Primary Care Fynlee Rowlands: SYSTEM, PCP Other Clinician: Referring Korbyn Chopin: Franchot Mimes Treating Gabryella Murfin/Extender: Kathreen Cosier in Treatment: 0 Abuse/Suicide Risk Screen Items Answer ABUSE/SUICIDE RISK SCREEN: Has anyone close to you tried to hurt or harm you recentlyo No Do you feel uncomfortable with anyone in your familyo No Has anyone forced you do things that you didnot want to doo No Do you have any thoughts of harming yourselfo No Patient displays signs or symptoms of abuse and/or neglect. No Electronic Signature(s) Signed: 05/13/2017 1:59:05 PM By: Curtis Sites Entered By: Curtis Sites on 05/13/2017 08:09:45 Lydia Buchanan ([id number]) -------------------------------------------------------------------------------- Activities of Daily Living Details Patient Name: Lydia Buchanan Date of Service: 05/13/2017 8:00 AM Medical Record Number: [id number] Patient Account Number: [id number] Date of Birth/Sex: [birth year] (84 y.o. Female) Treating RN: Curtis Sites Primary Care Sayre Mazor: SYSTEM, PCP Other Clinician: Referring Ethan Kasperski: Franchot Mimes Treating Krishiv Sandler/Extender: Kathreen Cosier in Treatment: 0 Activities of Daily Living Items Answer Activities of Daily Living (Please select one for each item) Drive Automobile Not Able Take Medications Need Assistance Use Telephone Completely Able Care for Appearance Need Assistance Use Toilet Need Assistance Bath / Shower Need Assistance Dress Self Need Assistance Feed Self Completely Able Walk Need Assistance Get In / Out Bed Need Assistance Housework Need Assistance Prepare Meals Need Assistance Handle Money Need  Assistance Shop for Self Not Able Electronic Signature(s) Signed: 05/13/2017 1:59:05 PM By: Curtis Sites Entered By: Curtis Sites on 05/13/2017 08:10:38 Lydia Buchanan ([id number]) -------------------------------------------------------------------------------- Education Assessment Details Patient Name: Lydia Buchanan Date of Service: 05/13/2017 8:00 AM Medical Record Number: [id number] Patient Account Number: [id number] Date of Birth/Sex: 07-Jun-1932 (84 y.o. Female) Treating RN: Curtis Sites Primary Care Tamila Gaulin: SYSTEM, PCP Other Clinician: Referring Maitland Muhlbauer: Franchot Mimes Treating Loyalty Arentz/Extender: Kathreen Cosier in Treatment: 0 Primary Learner Assessed: Patient Learning Preferences/Education Level/Primary Language Learning Preference: Explanation, Demonstration Highest Education Level: High School Preferred Language: English Cognitive Barrier Assessment/Beliefs Language Barrier: No Translator Needed: No Memory Deficit: No Emotional Barrier: No Cultural/Religious Beliefs Affecting Medical Care: No Physical Barrier Assessment Impaired Vision: No Impaired Hearing: No Decreased Hand dexterity: No Knowledge/Comprehension Assessment Knowledge Level: Medium Comprehension Level: Medium Ability to understand written Medium instructions: Ability to understand verbal Medium instructions: Motivation Assessment Anxiety Level: Calm Cooperation: Cooperative Education Importance: Acknowledges Need Interest in Health Problems: Asks Questions Perception: Coherent Willingness to Engage in Self- Medium Management Activities: Readiness to Engage in Self- Medium Management Activities: Electronic Signature(s) Signed: 05/13/2017 1:59:05 PM By: Curtis Sites Entered By: Curtis Sites on 05/13/2017 08:11:03 Lydia Buchanan ([id number]) -------------------------------------------------------------------------------- Fall Risk Assessment Details Patient Name: Lydia Buchanan Date of Service: 05/13/2017 8:00 AM Medical Record Number: [id number] Patient Account Number: [id number] Date of Birth/Sex: [birth year] (84 y.o. Female) Treating RN: Curtis Sites Primary Care Tarren Velardi: SYSTEM, PCP Other Clinician: Referring Aizlynn Digilio: Franchot Mimes Treating Calab Sachse/Extender: Kathreen Cosier in Treatment: 0 Fall Risk Assessment Items Have you had 2 or more falls in the last 12 monthso 0 No Have you had any fall that resulted in injury in the last 12 monthso 0 No FALL RISK ASSESSMENT: History of falling - immediate or within 3 months 0 No Secondary diagnosis 0 No Ambulatory aid None/bed rest/wheelchair/nurse 0 No Crutches/cane/walker 15 Yes Furniture 0 No IV Access/Saline Lock 0 No Gait/Training Normal/bed rest/immobile 0 No Weak  10 Yes Impaired 0 No Mental Status Oriented to own ability 0 Yes Electronic Signature(s) Signed: 05/13/2017 1:59:05 PM By: Curtis Sites Entered By: Curtis Sites on 05/13/2017 08:11:14 Lydia Buchanan ([id number]) -------------------------------------------------------------------------------- Foot Assessment Details Patient Name: Lydia Buchanan Date of Service: 05/13/2017 8:00 AM Medical Record Number: [id number] Patient Account Number: [id number] Date of Birth/Sex: [birth year] (84 y.o. Female) Treating RN: Curtis Sites Primary Care Stefany Starace: SYSTEM, PCP Other Clinician: Referring Nasya Vincent: Franchot Mimes Treating Yaacov Koziol/Extender: Kathreen Cosier in Treatment: 0 Foot Assessment Items Site Locations + = Sensation present, - = Sensation absent, C = Callus, U = Ulcer R = Redness, W = Warmth, M = Maceration, PU = Pre-ulcerative lesion F = Fissure, S = Swelling, D = Dryness Assessment Right: Left: Other Deformity: No No Prior Foot Ulcer: No No Prior Amputation: No No Charcot Joint: No No Ambulatory Status: Ambulatory With Help Assistance Device: Walker Gait: Steady Electronic Signature(s) Signed: 05/13/2017  1:59:05 PM By: Curtis Sites Entered By: Curtis Sites on 05/13/2017 08:28:19 Lydia Buchanan ([id number]) -------------------------------------------------------------------------------- Nutrition Risk Assessment Details Patient Name: Lydia Buchanan Date of Service: 05/13/2017 8:00 AM Medical Record Number: [id number] Patient Account Number: [id number] Date of Birth/Sex: 07/13/1932 (84 y.o. Female) Treating RN: Curtis Sites Primary Care Masyn Fullam: SYSTEM, PCP Other Clinician: Referring Gevon Markus: Franchot Mimes Treating Jude Linck/Extender: Kathreen Cosier in Treatment: 0 Height (in): Weight (lbs): Body Mass Index (BMI): Nutrition Risk Assessment Items NUTRITION RISK SCREEN: I have an illness or condition that made me change the kind and/or amount of 0 No food I eat I eat fewer than two meals per day 0 No I eat few fruits and vegetables, or milk products 0 No I have three or more drinks of beer, liquor or wine almost every day 0 No I have tooth or mouth problems that make it hard for me to eat 0 No I don't always have enough money to buy the food I need 0 No I eat alone most of the time 0 No I take three or more different prescribed or over-the-counter drugs a day 1 Yes Without wanting to, I have lost or gained 10 pounds in the last six months 0 No I am not always physically able to shop, cook and/or feed myself 0 No Nutrition Protocols Good Risk Protocol 0 No interventions needed Moderate Risk Protocol Electronic Signature(s) Signed: 05/13/2017 1:59:05 PM By: Curtis Sites Entered By: Curtis Sites on 05/13/2017 08:11:21

## 2017-05-17 ENCOUNTER — Encounter: Payer: Self-pay | Admitting: Emergency Medicine

## 2017-05-17 ENCOUNTER — Other Ambulatory Visit: Payer: Self-pay

## 2017-05-17 ENCOUNTER — Emergency Department: Payer: Medicare Other

## 2017-05-17 ENCOUNTER — Emergency Department
Admission: EM | Admit: 2017-05-17 | Discharge: 2017-05-17 | Disposition: A | Discharge status: Home / Self Care | Payer: Medicare Other | Attending: Emergency Medicine | Admitting: Emergency Medicine

## 2017-05-17 DIAGNOSIS — F410 Panic disorder [episodic paroxysmal anxiety] without agoraphobia: Secondary | ICD-10-CM | POA: Insufficient documentation

## 2017-05-17 DIAGNOSIS — Z87891 Personal history of nicotine dependence: Secondary | ICD-10-CM | POA: Insufficient documentation

## 2017-05-17 DIAGNOSIS — I509 Heart failure, unspecified: Secondary | ICD-10-CM | POA: Diagnosis not present

## 2017-05-17 DIAGNOSIS — R002 Palpitations: Secondary | ICD-10-CM | POA: Insufficient documentation

## 2017-05-17 DIAGNOSIS — F411 Generalized anxiety disorder: Secondary | ICD-10-CM

## 2017-05-17 DIAGNOSIS — I110 Hypertensive heart disease with heart failure: Secondary | ICD-10-CM | POA: Insufficient documentation

## 2017-05-17 DIAGNOSIS — Z79899 Other long term (current) drug therapy: Secondary | ICD-10-CM | POA: Insufficient documentation

## 2017-05-17 HISTORY — DX: Essential (primary) hypertension: I10

## 2017-05-17 HISTORY — DX: Disorder of thyroid, unspecified: E07.9

## 2017-05-17 HISTORY — DX: Unspecified atrial fibrillation: I48.91

## 2017-05-17 HISTORY — DX: Transient cerebral ischemic attack, unspecified: G45.9

## 2017-05-17 LAB — BASIC METABOLIC PANEL
ANION GAP: 6 (ref 5–15)
BUN: 15 mg/dL (ref 6–20)
CALCIUM: 8.4 mg/dL — AB (ref 8.9–10.3)
CO2: 29 mmol/L (ref 22–32)
Chloride: 104 mmol/L (ref 101–111)
Creatinine, Ser: 0.71 mg/dL (ref 0.44–1.00)
GFR calc Af Amer: >60 mL/min (ref >60)
Glucose, Bld: 92 mg/dL (ref 65–99)
Potassium: 2.8 mmol/L — ABNORMAL LOW (ref 3.5–5.1)
SODIUM: 139 mmol/L (ref 135–145)

## 2017-05-17 LAB — CBC
HCT: 32.8 % — ABNORMAL LOW (ref 35.0–47.0)
Hemoglobin: 11.1 g/dL — ABNORMAL LOW (ref 12.0–16.0)
MCH: 29.6 pg (ref 26.0–34.0)
MCHC: 33.8 g/dL (ref 32.0–36.0)
MCV: 87.6 fL (ref 80.0–100.0)
Platelets: 176 10*3/uL (ref 150–440)
RBC: 3.74 MIL/uL — ABNORMAL LOW (ref 3.80–5.20)
RDW: 16.2 % — AB (ref 11.5–14.5)
WBC: 4.6 10*3/uL (ref 3.6–11.0)

## 2017-05-17 LAB — TROPONIN I

## 2017-05-17 MED ORDER — LORAZEPAM 0.5 MG PO TABS
0.5000 mg | ORAL_TABLET | Freq: Once | ORAL | Status: DC
  Filled 2017-05-17: qty 1

## 2017-05-17 NOTE — ED Notes (Signed)
Pt reports being lonely at [facility], been there from [facility] for 1 week

## 2017-05-17 NOTE — Discharge Instructions (Signed)
It was a pleasure to take care of you today, and thank you for coming to our emergency department.  If you have any questions or concerns before leaving please ask the nurse to grab me and I'm more than happy to go through your aftercare instructions again.  If you were prescribed any opioid pain medication today such as Norco, Vicodin, Percocet, morphine, hydrocodone, or oxycodone please make sure you do not drive when you are taking this medication as it can alter your ability to drive safely.  If you have any concerns once you are home that you are not improving or are in fact getting worse before you can make it to your follow-up appointment, please do not hesitate to call 911 and come back for further evaluation.  Merrily Brittle, MD  Results for orders placed or performed during the hospital encounter of 05/17/17  Basic metabolic panel  Result Value Ref Range   Sodium 139 135 - 145 mmol/L   Potassium 2.8 (L) 3.5 - 5.1 mmol/L   Chloride 104 101 - 111 mmol/L   CO2 29 22 - 32 mmol/L   Glucose, Bld 92 65 - 99 mg/dL   BUN 15 6 - 20 mg/dL   Creatinine, Ser 1.61 0.44 - 1.00 mg/dL   Calcium 8.4 (L) 8.9 - 10.3 mg/dL   GFR calc non Af Amer >60 >60 mL/min   GFR calc Af Amer >60 >60 mL/min   Anion gap 6 5 - 15  CBC  Result Value Ref Range   WBC 4.6 3.6 - 11.0 K/uL   RBC 3.74 (L) 3.80 - 5.20 MIL/uL   Hemoglobin 11.1 (L) 12.0 - 16.0 g/dL   HCT 09.6 (L) 04.5 - 40.9 %   MCV 87.6 80.0 - 100.0 fL   MCH 29.6 26.0 - 34.0 pg   MCHC 33.8 32.0 - 36.0 g/dL   RDW 81.1 (H) 91.4 - 78.2 %   Platelets 176 150 - 440 K/uL  Troponin I  Result Value Ref Range   Troponin I <0.03 <0.03 ng/mL   Dg Chest 2 View  Result Date: 05/11/2017 CLINICAL DATA:  Shortness of breath EXAM: CHEST - 2 VIEW COMPARISON:  None. FINDINGS: Lungs are hyperexpanded. There is slight scarring in the bases. There is no edema or consolidation. There is cardiomegaly with pulmonary vascularity within normal limits. There is aortic  atherosclerosis. No adenopathy. There is degenerative change in the thoracic spine. IMPRESSION: Cardiomegaly.  Aortic atherosclerosis. Lungs hyperexpanded with mild bibasilar scarring. No edema or consolidation evident. Aortic Atherosclerosis (ICD10-I70.0). Electronically Signed   By: Bretta Bang III M.D.   On: 05/11/2017 07:18   US Venous Img Lower Bilateral  Result Date: 05/09/2017 CLINICAL DATA:  Bilateral leg pain with ulcers x1 month. EXAM: BILATERAL LOWER EXTREMITY VENOUS DOPPLER ULTRASOUND TECHNIQUE: Gray-scale sonography with graded compression, as well as color Doppler and duplex ultrasound were performed to evaluate the lower extremity deep venous systems from the level of the common femoral vein and including the common femoral, femoral, profunda femoral, popliteal and calf veins including the posterior tibial, peroneal and gastrocnemius veins when visible. The superficial great saphenous vein was also interrogated. Spectral Doppler was utilized to evaluate flow at rest and with distal augmentation maneuvers in the common femoral, femoral and popliteal veins. COMPARISON:  None. FINDINGS: RIGHT LOWER EXTREMITY Common Femoral Vein: No evidence of thrombus. Normal compressibility, respiratory phasicity and response to augmentation. Saphenofemoral Junction: No evidence of thrombus. Normal compressibility and flow on color Doppler imaging. Profunda Femoral  Vein: No evidence of thrombus. Normal compressibility and flow on color Doppler imaging. Femoral Vein: No evidence of thrombus. Normal compressibility, respiratory phasicity and response to augmentation. Popliteal Vein: No evidence of thrombus. Normal compressibility, respiratory phasicity and response to augmentation. Calf Veins: No evidence of thrombus. Normal compressibility and flow on color Doppler imaging. Superficial Great Saphenous Vein: No evidence of thrombus. Normal compressibility. Venous Reflux:  None. Other Findings:  None. LEFT LOWER  EXTREMITY Common Femoral Vein: No evidence of thrombus. Normal compressibility, respiratory phasicity and response to augmentation. Saphenofemoral Junction: No evidence of thrombus. Normal compressibility and flow on color Doppler imaging. Profunda Femoral Vein: No evidence of thrombus. Normal compressibility and flow on color Doppler imaging. Femoral Vein: No evidence of thrombus. Normal compressibility, respiratory phasicity and response to augmentation. Popliteal Vein: No evidence of thrombus. Normal compressibility, respiratory phasicity and response to augmentation. Calf Veins: No evidence of thrombus. Normal compressibility and flow on color Doppler imaging. Superficial Great Saphenous Vein: No evidence of thrombus. Normal compressibility. Venous Reflux:  None. Other Findings:  None. IMPRESSION: No evidence of deep venous thrombosis. Electronically Signed   By: Tollie Eth M.D.   On: 05/09/2017 03:12   Dg Chest Portable 1 View  Result Date: 05/17/2017 CLINICAL DATA:  84 y/o F; bilateral leg pain and shortness of breath. Sensation of heart jumping. EXAM: PORTABLE CHEST 1 VIEW COMPARISON:  05/11/2017 chest radiograph FINDINGS: Stable cardiomegaly given projection and technique. Aortic atherosclerosis with calcification. No focal consolidation. Stable blunting of the costal diaphragmatic angles. No acute osseous abnormality is evident. IMPRESSION: Stable cardiomegaly. Aortic atherosclerosis. Possible small effusions. No focal consolidation. Electronically Signed   By: Mitzi Hansen M.D.   On: 05/17/2017 06:51

## 2017-05-17 NOTE — ED Provider Notes (Signed)
[facility] Emergency Department Provider Note  ____________________________________________   First MD Initiated Contact with Patient 05/17/17 0703     (approximate)  I have reviewed the triage vital signs and the nursing notes.   HISTORY  Chief Complaint Palpitations   HPI Lydia Buchanan is a 84 y.o. female who comes to the emergency department by EMS after having a panic attack this morning in bed.  She has a long-standing history of chronic anxiety for which she has recently begun taking Lexapro 8 weeks ago.  She also takes Klonopin at night.  This morning while she was in bed she felt her heart "race" and she felt a "overwhelming sense of doom" and she had her nursing home staff called 911.  Her symptoms passed quickly.  She said that she "worries a lot".  Her symptoms come on suddenly are very intense and passed away on their own.  She has not yet to establish care with either a psychiatrist or therapist here in [place].  She has no pain at this time.  She is currently asymptomatic.  Past Medical History:  Diagnosis Date  . A-fib (HCC)   . Anxiety   . CHF (congestive heart failure) (HCC)   . Hypertension   . Thyroid disease   . TIA (transient ischemic attack)     Patient Active Problem List   Diagnosis Date Noted  . Generalized anxiety disorder 05/11/2017  . Panic attacks 05/11/2017    Past Surgical History:  Procedure Laterality Date  . ABDOMINAL HYSTERECTOMY    . BACK SURGERY      Prior to Admission medications   Medication Sig Start Date End Date Taking? Authorizing Provider  benzonatate (TESSALON) 100 MG capsule Take 100 mg by mouth 3 (three) times daily as needed for cough. 04/08/17   [provider]  carvedilol (COREG) 12.5 MG tablet Take 12.5 mg by mouth 2 (two) times daily. 05/03/17   [provider]  clonazePAM (KLONOPIN) 0.5 MG tablet Take 0.25 mg by mouth at bedtime as needed for anxiety. 05/07/17   [provider]  escitalopram (LEXAPRO) 20 MG tablet Take 20 mg by mouth daily. 04/26/17   [provider]  furosemide (LASIX) 40 MG tablet Take 40 mg by mouth daily. 03/24/17   [provider]  KLOR-CON M20 20 MEQ tablet Take 20 mEq by mouth every other day. 05/07/17   [provider]  levothyroxine (SYNTHROID, LEVOTHROID) 100 MCG tablet Take 100 mcg by mouth daily. 05/07/17   [provider]  losartan (COZAAR) 25 MG tablet Take 25 mg by mouth daily. 05/03/17   [provider]  rosuvastatin (CRESTOR) 20 MG tablet Take 20 mg by mouth daily. 04/26/17   [provider]  tamsulosin (FLOMAX) 0.4 MG CAPS capsule Take 0.4 mg by mouth daily. 05/07/17   [provider]  traMADol (ULTRAM) 50 MG tablet Take 1 tablet (50 mg total) by mouth every 6 (six) hours as needed. 05/09/17   Irean Hong, MD    Allergies Macrobid [nitrofurantoin macrocrystal] and Oxycodone  History reviewed. No pertinent family history.  Social History Social History   Tobacco Use  . Smoking status: Former Games developer  . Smokeless tobacco: Never Used  Substance Use Topics  . Alcohol use: Not Currently  . Drug use: Never    Review of Systems Constitutional: No fever/chills Eyes: No visual changes. ENT: No sore throat. Cardiovascular: Denies chest pain. Respiratory: Positive for shortness of breath. Gastrointestinal: No abdominal pain.  Positive for nausea, no vomiting.  No diarrhea.  No constipation. Genitourinary: Negative for dysuria. Musculoskeletal: Negative for back pain. Skin: Negative for rash. Neurological: Negative for headaches, focal weakness or numbness.   ____________________________________________   PHYSICAL EXAM:  VITAL SIGNS: ED Triage Vitals  Enc Vitals Group     BP 05/17/17 0615 (!) 159/72     Pulse Rate 05/17/17 0615 72     Resp 05/17/17 0615 11     Temp 05/17/17 0615 98.2 F (36.8 C)     Temp Source 05/17/17 0615 Oral     SpO2  05/17/17 0615 97 %     Weight 05/17/17 0620 112 lb (50.8 kg)     Height 05/17/17 0620  (1.575 m)     Head Circumference --      Peak Flow --      Pain Score 05/17/17 0618 8     Pain Loc --      Pain Edu? --      Excl. in GC? --     Constitutional: Alert and oriented x4 quite anxious appearing nontoxic no diaphoresis speaks in full clear sentences Eyes: PERRL EOMI. Head: Atraumatic. Nose: No congestion/rhinnorhea. Mouth/Throat: No trismus Neck: No stridor.  Nonpalpable thyroid Cardiovascular: Irregularly irregular although regular rate Respiratory: Normal respiratory effort.  No retractions. Lungs CTAB and moving good air Gastrointestinal: Soft nontender Musculoskeletal: No lower extremity edema   Neurologic:  Normal speech and language. No gross focal neurologic deficits are appreciated. Skin:  Skin is warm, dry and intact. No rash noted. Psychiatric: Anxious appearing   ____________________________________________   DIFFERENTIAL includes but not limited to  Panic attack, hyperthyroidism, generalized anxiety disorder, atrial fibrillation with rapid ventricular response ____________________________________________   LABS (all labs ordered are listed, but only abnormal results are displayed)  Labs Reviewed  BASIC METABOLIC PANEL - Abnormal; Notable for the following components:      Result Value   Potassium 2.8 (*)    Calcium 8.4 (*)    All other components within normal limits  CBC - Abnormal; Notable for the following components:   RBC 3.74 (*)    Hemoglobin 11.1 (*)    HCT 32.8 (*)    RDW 16.2 (*)    All other components within normal limits  TROPONIN I    Lab work reviewed by me with clinically insignificant hypokalemia __________________________________________  EKG  ED ECG REPORT I, Merrily Brittle, the attending physician, personally viewed and interpreted this ECG.  Date: 05/17/2017 EKG Time:  Rate: 72 Rhythm: Atrial fibrillation QRS Axis:  Rightward axis Intervals: normal ST/T Wave abnormalities: normal Narrative Interpretation: no evidence of acute ischemia  ____________________________________________  RADIOLOGY   ____________________________________________   PROCEDURES  Procedure(s) performed: no  Procedures  Critical Care performed: no  Observation: no ____________________________________________   INITIAL IMPRESSION / ASSESSMENT AND PLAN / ED COURSE  Pertinent labs & imaging results that were available during my care of the patient were reviewed by me and considered in my medical decision making (see chart for details).  The patient arrives anxious appearing although clinically well.  Her labs are largely unremarkable aside from clinically insignificant hypokalemia.  EKG shows chronic atrial fibrillation.  Had a lengthy discussion with the patient regarding the importance of establishing care with both a psychiatrist and a therapist here in [place].  I have given her a single dose of Ativan now and she is medically stable for outpatient management verbalizes understanding and agreement the plan.      ____________________________________________  FINAL CLINICAL IMPRESSION(S) / ED DIAGNOSES  Final diagnoses:  Palpitations  Panic attack  Generalized anxiety disorder      NEW MEDICATIONS STARTED DURING THIS VISIT:  New Prescriptions   No medications on file     Note:  This document was prepared using Dragon voice recognition software and may include unintentional dictation errors.     Merrily Brittle, MD 05/17/17 [phone]

## 2017-05-17 NOTE — ED Notes (Signed)
Pt instructed this RN to call Romilda Garret [phone] in reference to a ride home, Elease Hashimoto informed this RN that patient's daughter in law was coming to visit with patient and would be notified that patient was ready for D/C. Pt also now c/o abdominal pain, MD notified, no new orders received.

## 2017-05-17 NOTE — ED Notes (Addendum)
Pt says she woke this am with the sensation of her heart beating fast so she called 911; pt continually worried she's going to "be in trouble" with [facility] staff, her family or ED staff; pt reassured that she is not in any trouble; pt says she moved to [facility] 1 week ago-this would be her 3rd visit to the ED since she's moved; pt denies chest pain; alert and oriented x 3; talking in complete coherent sentences

## 2017-05-17 NOTE — ED Notes (Signed)
This RN called [facility] and spoke with Kathrin Greathouse who states that [facility] does not have transport back to facility.

## 2017-05-17 NOTE — ED Notes (Signed)
This RN to bedside at this time. NAD noted. Pt resting in bed with eyes closed, respirations even and unlabored, skin warm, dry, and intact. Will continue to monitor for further patient needs.

## 2017-05-17 NOTE — ED Notes (Signed)
Pt called out, this RN to bedside, pt noted to be resting comfortably with eyes closed, respirations even and unlabored. This RN did not awaken patient, allowing patient to continue to rest. Will continue to monitor for further patient needs.

## 2017-05-17 NOTE — ED Notes (Signed)
NAD noted at time of D/C. Pt denies questions or concerns. Pt taken to lobby via wheelchair by this RN. D/C into the care of Bradin Mcadory from [facility].

## 2017-05-17 NOTE — ED Notes (Signed)
MD to bedside to address patient concerns regarding abdominal pain. Pt has tolerated PO without difficulty. Lydia Buchanan from [facility] here to pick patient up.

## 2017-05-17 NOTE — ED Triage Notes (Signed)
Patient presents to Emergency Department via EMS with complaints of "heart jumping" and bilateral leg pain, pt seen at the wound clinic at [facility] and recently had her legs wrapped.  Legs feel edematous.   Pt continues to repeat "I'm gonna be in trouble"  Pt was here on 4/30 for similar complaints,   Per EMS [facility] will pick up pt when discharged.

## 2017-05-18 ENCOUNTER — Emergency Department
Admission: EM | Admit: 2017-05-18 | Discharge: 2017-05-18 | Disposition: A | Discharge status: Home / Self Care | Payer: Medicare Other | Attending: Emergency Medicine | Admitting: Emergency Medicine

## 2017-05-18 ENCOUNTER — Other Ambulatory Visit: Payer: Self-pay

## 2017-05-18 DIAGNOSIS — Z79899 Other long term (current) drug therapy: Secondary | ICD-10-CM | POA: Insufficient documentation

## 2017-05-18 DIAGNOSIS — Z8673 Personal history of transient ischemic attack (TIA), and cerebral infarction without residual deficits: Secondary | ICD-10-CM | POA: Insufficient documentation

## 2017-05-18 DIAGNOSIS — I509 Heart failure, unspecified: Secondary | ICD-10-CM | POA: Diagnosis not present

## 2017-05-18 DIAGNOSIS — F419 Anxiety disorder, unspecified: Secondary | ICD-10-CM | POA: Diagnosis not present

## 2017-05-18 DIAGNOSIS — I110 Hypertensive heart disease with heart failure: Secondary | ICD-10-CM | POA: Insufficient documentation

## 2017-05-18 DIAGNOSIS — R109 Unspecified abdominal pain: Secondary | ICD-10-CM | POA: Diagnosis not present

## 2017-05-18 DIAGNOSIS — E876 Hypokalemia: Secondary | ICD-10-CM

## 2017-05-18 LAB — URINALYSIS, COMPLETE (UACMP) WITH MICROSCOPIC
Bacteria, UA: NONE SEEN
Bilirubin Urine: NEGATIVE
Glucose, UA: NEGATIVE mg/dL
Hgb urine dipstick: NEGATIVE
Ketones, ur: NEGATIVE mg/dL
Leukocytes, UA: NEGATIVE
NITRITE: NEGATIVE
PROTEIN: NEGATIVE mg/dL
SPECIFIC GRAVITY, URINE: 1.011 (ref 1.005–1.030)
Squamous Epithelial / LPF: NONE SEEN (ref 0–5)
pH: 7 (ref 5.0–8.0)

## 2017-05-18 LAB — COMPREHENSIVE METABOLIC PANEL
ALK PHOS: 61 U/L (ref 38–126)
ALT: 15 U/L (ref 14–54)
ANION GAP: 6 (ref 5–15)
AST: 28 U/L (ref 15–41)
Albumin: 3.4 g/dL — ABNORMAL LOW (ref 3.5–5.0)
BILIRUBIN TOTAL: 0.9 mg/dL (ref 0.3–1.2)
BUN: 16 mg/dL (ref 6–20)
CALCIUM: 8.9 mg/dL (ref 8.9–10.3)
CO2: 31 mmol/L (ref 22–32)
Chloride: 103 mmol/L (ref 101–111)
Creatinine, Ser: 0.74 mg/dL (ref 0.44–1.00)
Glucose, Bld: 92 mg/dL (ref 65–99)
POTASSIUM: 2.8 mmol/L — AB (ref 3.5–5.1)
Sodium: 140 mmol/L (ref 135–145)
TOTAL PROTEIN: 7.1 g/dL (ref 6.5–8.1)

## 2017-05-18 LAB — CBC WITH DIFFERENTIAL/PLATELET
Basophils Absolute: 0 10*3/uL (ref 0–0.1)
Basophils Relative: 1 %
Eosinophils Absolute: 0.1 10*3/uL (ref 0–0.7)
Eosinophils Relative: 3 %
HEMATOCRIT: 34.9 % — AB (ref 35.0–47.0)
HEMOGLOBIN: 11.6 g/dL — AB (ref 12.0–16.0)
LYMPHS PCT: 28 %
Lymphs Abs: 1.2 10*3/uL (ref 1.0–3.6)
MCH: 29.4 pg (ref 26.0–34.0)
MCHC: 33.4 g/dL (ref 32.0–36.0)
MCV: 88.1 fL (ref 80.0–100.0)
MONO ABS: 0.5 10*3/uL (ref 0.2–0.9)
Monocytes Relative: 11 %
NEUTROS ABS: 2.5 10*3/uL (ref 1.4–6.5)
Neutrophils Relative %: 57 %
Platelets: 195 10*3/uL (ref 150–440)
RBC: 3.96 MIL/uL (ref 3.80–5.20)
RDW: 16.1 % — ABNORMAL HIGH (ref 11.5–14.5)
WBC: 4.3 10*3/uL (ref 3.6–11.0)

## 2017-05-18 LAB — LIPASE, BLOOD: LIPASE: 37 U/L (ref 11–51)

## 2017-05-18 LAB — TROPONIN I

## 2017-05-18 MED ORDER — POTASSIUM CHLORIDE ER 20 MEQ PO TBCR: 20.0000 meq | EXTENDED_RELEASE_TABLET | Freq: Every day | ORAL | 0 refills | Status: AC

## 2017-05-18 MED ORDER — POTASSIUM CHLORIDE 10 MEQ/100ML IV SOLN
10.0000 meq | Freq: Once | INTRAVENOUS | Status: DC
  Filled 2017-05-18: qty 100

## 2017-05-18 MED ORDER — LOPERAMIDE HCL 2 MG PO CAPS
ORAL_CAPSULE | ORAL | Status: AC
  Administered 2017-05-18: 2 mg via ORAL
  Filled 2017-05-18: qty 1

## 2017-05-18 MED ORDER — LOPERAMIDE HCL 2 MG PO CAPS
2.0000 mg | ORAL_CAPSULE | Freq: Once | ORAL | Status: AC
  Administered 2017-05-18: 2 mg via ORAL

## 2017-05-18 MED ORDER — POTASSIUM CHLORIDE CRYS ER 20 MEQ PO TBCR
40.0000 meq | EXTENDED_RELEASE_TABLET | Freq: Once | ORAL | Status: AC
  Administered 2017-05-18: 40 meq via ORAL
  Filled 2017-05-18: qty 2

## 2017-05-18 NOTE — ED Provider Notes (Addendum)
[facility] Emergency Department Provider Note  ____________________________________________   I have reviewed the triage vital signs and the nursing notes. Where available I have reviewed prior notes and, if possible and indicated, outside hospital notes.    HISTORY  Chief Complaint Abdominal Pain    HPI Lydia Buchanan is a 84 y.o. female  w hx of chf anxiety afib panic attacks. She was moved, per ems, up here from home recently to stay in a nursing facility and since that time she has come to the emergency room 4 times with different complaints.  She says that she is here because she felt some lower abdominal discomfort briefly last night and now it is gone and she says she should not have come. She says she feels fine. The pain was very vague and she poorly describes it.  She has no n/v/d/dysuria/fever/chills/chest pain/ ha, diarrhea, or other complaints.  She says that she was anxious about things in general and wanted to be checked out.    Past Medical History:  Diagnosis Date  . A-fib (HCC)   . Anxiety   . CHF (congestive heart failure) (HCC)   . Hypertension   . Thyroid disease   . TIA (transient ischemic attack)     Patient Active Problem List   Diagnosis Date Noted  . Generalized anxiety disorder 05/11/2017  . Panic attacks 05/11/2017    Past Surgical History:  Procedure Laterality Date  . ABDOMINAL HYSTERECTOMY    . BACK SURGERY      Prior to Admission medications   Medication Sig Start Date End Date Taking? Authorizing Provider  benzonatate (TESSALON) 100 MG capsule Take 100 mg by mouth 3 (three) times daily as needed for cough. 04/08/17   [provider]  carvedilol (COREG) 12.5 MG tablet Take 12.5 mg by mouth 2 (two) times daily. 05/03/17   [provider]  clonazePAM (KLONOPIN) 0.5 MG tablet Take 0.25 mg by mouth at bedtime as needed for anxiety. 05/07/17   [provider]  escitalopram (LEXAPRO) 20 MG tablet  Take 20 mg by mouth daily. 04/26/17   [provider]  furosemide (LASIX) 40 MG tablet Take 40 mg by mouth daily. 03/24/17   [provider]  KLOR-CON M20 20 MEQ tablet Take 20 mEq by mouth every other day. 05/07/17   [provider]  levothyroxine (SYNTHROID, LEVOTHROID) 100 MCG tablet Take 100 mcg by mouth daily. 05/07/17   [provider]  losartan (COZAAR) 25 MG tablet Take 25 mg by mouth daily. 05/03/17   [provider]  rosuvastatin (CRESTOR) 20 MG tablet Take 20 mg by mouth daily. 04/26/17   [provider]  tamsulosin (FLOMAX) 0.4 MG CAPS capsule Take 0.4 mg by mouth daily. 05/07/17   [provider]  traMADol (ULTRAM) 50 MG tablet Take 1 tablet (50 mg total) by mouth every 6 (six) hours as needed. 05/09/17   Irean Hong, MD    Allergies Macrobid [nitrofurantoin macrocrystal] and Oxycodone  No family history on file.  Social History Social History   Tobacco Use  . Smoking status: Former Games developer  . Smokeless tobacco: Never Used  Substance Use Topics  . Alcohol use: Not Currently  . Drug use: Never    Review of Systems Constitutional: No fever/chills Eyes: No visual changes. ENT: No sore throat. No stiff neck no neck pain Cardiovascular: Denies chest pain. Respiratory: Denies shortness of breath. Gastrointestinal:   no vomiting.  No diarrhea.  No constipation. Genitourinary: Negative  for dysuria. Musculoskeletal: Negative lower extremity swelling Skin: Negative for rash. Neurological: Negative for severe headaches, focal weakness or numbness.   ____________________________________________   PHYSICAL EXAM:  VITAL SIGNS: ED Triage Vitals  Enc Vitals Group     BP --      Pulse --      Resp --      Temp --      Temp src --      SpO2 --      Weight 05/18/17 0701 112 lb (50.8 kg)     Height 05/18/17 0701  (1.651 m)     Head Circumference --      Peak Flow --      Pain Score 05/18/17 0702 5     Pain  Loc --      Pain Edu? --      Excl. in GC? --     Constitutional: Alert and oriented. Well appearing and in no acute distress. Eyes: Conjunctivae are normal Head: Atraumatic HEENT: No congestion/rhinnorhea. Mucous membranes are moist.  Oropharynx non-erythematous Neck:   Nontender with no meningismus, no masses, no stridor Cardiovascular: Normal rate, regular rhythm. Grossly normal heart sounds.  Good peripheral circulation. Respiratory: Normal respiratory effort.  No retractions. Lungs CTAB. Abdominal: Soft and nontender. No distention. No guarding no rebound Back:  There is no focal tenderness or step off.  there is no midline tenderness there are no lesions noted. there is no CVA tenderness Musculoskeletal: No lower extremity tenderness, no upper extremity tenderness. No joint effusions, no DVT signs strong distal pulses no edema Neurologic:  Normal speech and language. No gross focal neurologic deficits are appreciated.  Skin:  Skin is warm, dry she has dressings on her legs which she asked me not to take down as she will see the wound care mgr today.  Psychiatric: Mood and affect are anxious. Speech and behavior are normal.  ____________________________________________   LABS (all labs ordered are listed, but only abnormal results are displayed)  Labs Reviewed  CBC WITH DIFFERENTIAL/PLATELET  COMPREHENSIVE METABOLIC PANEL  LIPASE, BLOOD  TROPONIN I  URINALYSIS, COMPLETE (UACMP) WITH MICROSCOPIC    Pertinent labs  results that were available during my care of the patient were reviewed by me and considered in my medical decision making (see chart for details). ____________________________________________  EKG  I personally interpreted any EKGs ordered by me or triage Atrial fibrillation no acute ischemic changes normal axis rate 77 ____________________________________________  RADIOLOGY  Pertinent labs & imaging results that were available during my care of the patient  were reviewed by me and considered in my medical decision making (see chart for details). If possible, patient and/or family made aware of any abnormal findings.  No results found. ____________________________________________    PROCEDURES  Procedure(s) performed: None  Procedures  Critical Care performed: None  ____________________________________________   INITIAL IMPRESSION / ASSESSMENT AND PLAN / ED COURSE  Pertinent labs & imaging results that were available during my care of the patient were reviewed by me and considered in my medical decision making (see chart for details).  Pt appears to be having trouble adjusting to her new living conditions.  She has been here on a nearly daily basis since moving up here. She has no complaint at this time. However, she is 20, with a complaint, however faint, of abdominal pain. I do not think imaging is warranted. If basic bloodwork is reassuring I feel she likely will be safe to return. She is in nad  and has no evidence of decompensated psychological (no si no hi no hallucinations) or physical disease.     ____________________________________________   FINAL CLINICAL IMPRESSION(S) / ED DIAGNOSES  Final diagnoses:  None      This chart was dictated using voice recognition software.  Despite best efforts to proofread,  errors can occur which can change meaning.      Jeanmarie Plant, MD 05/18/17 4098    Jeanmarie Plant, MD 05/18/17 0800

## 2017-05-18 NOTE — Progress Notes (Addendum)
PARLEE, AMESCUA ([id number]) Visit Report for 05/13/2017 Chief Complaint Document Details Patient Name: Lydia Buchanan, Lydia Buchanan Date of Service: 05/13/2017 8:00 AM Medical Record Number: [id number] Patient Account Number: [id number] Date of Birth/Sex: 1932-12-02 (84 y.o. F) Treating RN: Phillis Haggis Primary Care Provider: PATIENT, NO Other Clinician: Referring Provider: Franchot Mimes Treating Provider/Extender: Kathreen Cosier in Treatment: 0 Information Obtained from: Patient Chief Complaint She is here for evaluation of multiple bilateral lower extremity ulcers Electronic Signature(s) Signed: 05/13/2017 10:13:28 AM By: Bonnell Public Entered By: Bonnell Public on 05/13/2017 10:13:28 Lydia Buchanan ([id number]) -------------------------------------------------------------------------------- HPI Details Patient Name: Lydia Buchanan Date of Service: 05/13/2017 8:00 AM Medical Record Number: [id number] Patient Account Number: [id number] Date of Birth/Sex: July 26, 1932 (84 y.o. F) Treating RN: Phillis Haggis Primary Care Provider: PATIENT, NO Other Clinician: Referring Provider: Franchot Mimes Treating Provider/Extender: Kathreen Cosier in Treatment: 0 History of Present Illness HPI Description: 05/13/17-She is here for initial evaluation of bilateral lower extremity ulcers. She recently moved here from [place], resides at [facility]. She states that approximately 6-8 weeks ago she developed bilateral lower extremity edema with blisters which resulted in ulcerations. She has been going to [place] [facility] and was being treated with Xeroform and thigh-high TED hose compression. She was on Eliquis for atrial fibrillation, but that was DC'd approximately 3 weeks ago by cardiology when she was hospitalized after uncontrolled bleeding from the left lower extremity ulcer. She is extremely anxious, nervous and would prefer no debridement at today's appointment; repeating questions. We  will initiate home health. She is non-diabetic and a former smoker, quit  10 years ago. According to Epic records, she has been to the emergency department twice since arrival to [facility]; visit on 4/28 for BLE pain a DVT study was done and negative on 4/30 visit her daughter requested a psych evaluation r/t manipulative behavior and extreme anxiety. Electronic Signature(s) Signed: 05/27/2017 7:55:24 AM By: Bonnell Public Previous Signature: 05/13/2017 10:48:41 AM Version By: Bonnell Public Previous Signature: 05/13/2017 10:38:08 AM Version By: Bonnell Public Previous Signature: 05/13/2017 10:19:24 AM Version By: Bonnell Public Entered By: Bonnell Public on 05/27/2017 07:55:24 Lydia Buchanan ([id number]) -------------------------------------------------------------------------------- Physical Exam Details Patient Name: Lydia Buchanan Date of Service: 05/13/2017 8:00 AM Medical Record Number: [id number] Patient Account Number: [id number] Date of Birth/Sex: Jul 17, 1932 (84 y.o. F) Treating RN: Phillis Haggis Primary Care Provider: PATIENT, NO Other Clinician: Referring Provider: Franchot Mimes Treating Provider/Extender: Kathreen Cosier in Treatment: 0 Respiratory respirations are even and unlabored. clear throughout. Cardiovascular s1 s2 irregular rhythm, controlled rate. BLE- palpable DP, PT. BLE- warm to touch, non-pitting edema, reticular/varicose/spider veins present. Musculoskeletal ambulated with walker assistance. Psychiatric unclear baseline d/t anxiety and fear during this appointment. oriented x4. anxious, scared, resistant to intervention/debridement. Electronic Signature(s) Signed: 05/13/2017 10:21:55 AM By: Bonnell Public Entered By: Bonnell Public on 05/13/2017 10:21:54 Lydia Buchanan ([id number]) -------------------------------------------------------------------------------- Physician Orders Details Patient Name: Lydia Buchanan Date of Service: 05/13/2017 8:00  AM Medical Record Number: [id number] Patient Account Number: [id number] Date of Birth/Sex: 06-28-1932 (84 y.o. F) Treating RN: Phillis Haggis Primary Care Provider: PATIENT, NO Other Clinician: Referring Provider: Franchot Mimes Treating Provider/Extender: Kathreen Cosier in Treatment: 0 Verbal / Phone Orders: Yes Clinician: Pinkerton, Debi Read Back and Verified: Yes Diagnosis Coding Wound Cleansing Wound #1 Right,Lateral Lower Leg o Clean wound with Normal Saline. o Cleanse wound with mild soap and water Wound #2 Right,Posterior Lower Leg o Clean wound with Normal Saline. o Cleanse wound with mild soap and water Wound #3 Left,Anterior Lower Leg o Clean  wound with Normal Saline. o Cleanse wound with mild soap and water Anesthetic (add to Medication List) Wound #1 Right,Lateral Lower Leg o Topical Lidocaine 4% cream applied to wound bed prior to debridement (In Clinic Only). Wound #2 Right,Posterior Lower Leg o Topical Lidocaine 4% cream applied to wound bed prior to debridement (In Clinic Only). Wound #3 Left,Anterior Lower Leg o Topical Lidocaine 4% cream applied to wound bed prior to debridement (In Clinic Only). Primary Wound Dressing Wound #1 Right,Lateral Lower Leg o Hydrafera Blue Ready Transfer Wound #2 Right,Posterior Lower Leg o Hydrafera Blue Ready Transfer Wound #3 Left,Anterior Lower Leg o Hydrafera Blue Ready Transfer Secondary Dressing Wound #1 Right,Lateral Lower Leg o ABD pad Wound #2 Right,Posterior Lower Leg o ABD pad Wound #3 Left,Anterior Lower Leg o ABD pad Dressing Change Frequency Bothun, Blinda ([id number]) Wound #1 Right,Lateral Lower Leg o Three times weekly - HHRN to change Saturday, Tuesday and pt will come to Wound Care Clinic on Thursday. Wound #2 Right,Posterior Lower Leg o Three times weekly - HHRN to change Saturday, Tuesday and pt will come to Wound Care Clinic on Thursday. Wound #3 Left,Anterior  Lower Leg o Three times weekly - HHRN to change Saturday, Tuesday and pt will come to Wound Care Clinic on Thursday. Follow-up Appointments Wound #1 Right,Lateral Lower Leg o Return Appointment in 1 week. Wound #2 Right,Posterior Lower Leg o Return Appointment in 1 week. Wound #3 Left,Anterior Lower Leg o Return Appointment in 1 week. Edema Control Wound #1 Right,Lateral Lower Leg o 3 Layer Compression System - Bilateral - unna to anchor o Elevate legs to the level of the heart and pump ankles as often as possible Wound #2 Right,Posterior Lower Leg o 3 Layer Compression System - Bilateral - unna to anchor o Elevate legs to the level of the heart and pump ankles as often as possible Wound #3 Left,Anterior Lower Leg o 3 Layer Compression System - Bilateral - unna to anchor o Elevate legs to the level of the heart and pump ankles as often as possible Additional Orders / Instructions Wound #1 Right,Lateral Lower Leg o Increase protein intake. Wound #2 Right,Posterior Lower Leg o Increase protein intake. Wound #3 Left,Anterior Lower Leg o Increase protein intake. Home Health Wound #1 Right,Lateral Lower Leg o Initiate Home Health for Skilled Nursing o Continue Home Health Visits o Home Health Nurse may visit PRN to address patientos wound care needs. o FACE TO FACE ENCOUNTER: MEDICARE and MEDICAID PATIENTS: I certify that this patient is under my care and that I had a face-to-face encounter that meets the physician face-to-face encounter requirements with this patient on this date. The encounter with the patient was in whole or in part for the following MEDICAL CONDITION: (primary reason for Home Healthcare) MEDICAL NECESSITY: I certify, that based on my findings, NURSING services are a medically necessary home health service. HOME BOUND STATUS: I certify that my clinical findings support that this patient is homebound (i.e., Due to illness or injury,  pt requires aid of supportive devices such as crutches, cane, wheelchairs, walkers, the use of special transportation or the assistance of another person to leave their place of residence. There is a normal inability to leave the home [facility], [place] ([id number]) and doing so requires considerable and taxing effort. Other absences are for medical reasons / religious services and are infrequent or of short duration when for other reasons). o If current dressing causes regression in wound condition, may D/C ordered dressing product/s and apply Normal Saline  Moist Dressing daily until next Wound Healing Center / Other MD appointment. Notify Wound Healing Center of regression in wound condition at [phone]. o Please direct any NON-WOUND related issues/requests for orders to patient's Primary Care Physician Wound #2 Right,Posterior Lower Leg o Initiate Home Health for Skilled Nursing o Continue Home Health Visits o Home Health Nurse may visit PRN to address patientos wound care needs. o FACE TO FACE ENCOUNTER: MEDICARE and MEDICAID PATIENTS: I certify that this patient is under my care and that I had a face-to-face encounter that meets the physician face-to-face encounter requirements with this patient on this date. The encounter with the patient was in whole or in part for the following MEDICAL CONDITION: (primary reason for Home Healthcare) MEDICAL NECESSITY: I certify, that based on my findings, NURSING services are a medically necessary home health service. HOME BOUND STATUS: I certify that my clinical findings support that this patient is homebound (i.e., Due to illness or injury, pt requires aid of supportive devices such as crutches, cane, wheelchairs, walkers, the use of special transportation or the assistance of another person to leave their place of residence. There is a normal inability to leave the home and doing so requires considerable and taxing effort. Other absences  are for medical reasons / religious services and are infrequent or of short duration when for other reasons). o If current dressing causes regression in wound condition, may D/C ordered dressing product/s and apply Normal Saline Moist Dressing daily until next Wound Healing Center / Other MD appointment. Notify Wound Healing Center of regression in wound condition at [phone]. o Please direct any NON-WOUND related issues/requests for orders to patient's Primary Care Physician Wound #3 Left,Anterior Lower Leg o Initiate Home Health for Skilled Nursing o Continue Home Health Visits o Home Health Nurse may visit PRN to address patientos wound care needs. o FACE TO FACE ENCOUNTER: MEDICARE and MEDICAID PATIENTS: I certify that this patient is under my care and that I had a face-to-face encounter that meets the physician face-to-face encounter requirements with this patient on this date. The encounter with the patient was in whole or in part for the following MEDICAL CONDITION: (primary reason for Home Healthcare) MEDICAL NECESSITY: I certify, that based on my findings, NURSING services are a medically necessary home health service. HOME BOUND STATUS: I certify that my clinical findings support that this patient is homebound (i.e., Due to illness or injury, pt requires aid of supportive devices such as crutches, cane, wheelchairs, walkers, the use of special transportation or the assistance of another person to leave their place of residence. There is a normal inability to leave the home and doing so requires considerable and taxing effort. Other absences are for medical reasons / religious services and are infrequent or of short duration when for other reasons). o If current dressing causes regression in wound condition, may D/C ordered dressing product/s and apply Normal Saline Moist Dressing daily until next Wound Healing Center / Other MD appointment. Notify Wound Healing  Center of regression in wound condition at [phone]. o Please direct any NON-WOUND related issues/requests for orders to patient's Primary Care Physician Patient Medications Allergies: Macrobid, oxycodone Notifications Medication Indication Start End lidocaine DOSE 1 - topical 4 % cream - 1 cream topical Electronic Signature(s) Signed: 05/13/2017 12:54:19 PM By: Bonnell Public Signed: 05/14/2017 4:40:49 PM By: Orson Eva, Shaylea ([id number]) Entered By: Alejandro Mulling on 05/13/2017 09:08:03 Lydia Buchanan ([id number]) -------------------------------------------------------------------------------- Prescription 05/13/2017 Patient Name: Lydia Buchanan Provider: Bonnell Public NP Date  of Birth: [birth year] NPI#: [id number] Sex: F DEA#: [id number] Phone #: [phone] License #: Patient Address: [facility] [address] [facility] [address] [facility] [address] [place], [place] [address] [place], [place] [address] [phone] Allergies Macrobid oxycodone Medication Medication: Route: Strength: Form: lidocaine topical 4% cream Class: TOPICAL LOCAL ANESTHETICS Dose: Frequency / Time: Indication: 1 1 cream topical Number of Refills: Number of Units: 0 Generic Substitution: Start Date: End Date: Administered at Substitution Permitted Facility: Yes Time Administered: Time Discontinued: Note to Pharmacy: Signature(s): Date(s): Electronic Signature(s) Signed: 05/13/2017 12:54:19 PM By: Bonnell Public Signed: 05/14/2017 4:40:49 PM By: Orson Eva, Marticia ([id number]) Entered By: Alejandro Mulling on 05/13/2017 09:08:05 Lydia Buchanan ([id number]) --------------------------------------------------------------------------------  Problem List Details Patient Name: Lydia Buchanan Date of Service: 05/13/2017 8:00 AM Medical Record Number: [id number] Patient Account  Number: [id number] Date of Birth/Sex: 1932/08/25 (84 y.o. 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 F) Treating RN: Phillis Haggis Primary Care Provider: PATIENT, NO Other Clinician: Referring Provider: Franchot Mimes Treating Provider/Extender: Kathreen Cosier in Treatment: 0 Subjective Chief Complaint Information obtained from Patient She is here for evaluation of multiple bilateral lower extremity ulcers History of Present Illness (HPI) 05/13/17-She is here for initial evaluation of bilateral location big ulcers. She recently moved here from [place], resides at [facility]. She states that approximately 6-8 weeks ago she developed bilateral lower extremity edema with blisters which resulted in ulcerations. She has been going to [place] wound clinic and was being treated with Xeroform and thigh-high TED hose compression. She was on Eliquis for atrial fibrillation, but that was DC'd approximately 3 weeks ago by  cardiology when she was hospitalized after uncontrolled bleeding from the left lower extremity ulcer. She is extremely anxious, nervous and would prefer no debridement at today's appointment; repeating questions. We will initiate home health. She is non-diabetic and a former smoker, quit  10 years ago. According to Epic records, she has been to the emergency department twice since arrival to [facility]; visit on 4/28 for BLE pain a DVT study was done and negative on 4/30 visit her daughter requested a psych evaluation r/t manipulative behavior and extreme anxiety. Wound History Patient presents with 2 open wounds that have been present for approximately 6 weeks. Patient has been treating wounds in the following manner: unknown. Laboratory tests have not been performed in the last month. Patient reportedly has not tested positive for an antibiotic resistant organism. Patient reportedly has not tested positive for osteomyelitis. Patient reportedly has not had testing performed to evaluate circulation in the legs. Patient History Information obtained from Patient. Allergies Macrobid, oxycodone Family History Cancer - Child, Heart Disease - Mother,Father, Hypertension - Mother,Father, No family history of Diabetes, Hereditary Spherocytosis, Kidney Disease, Lung Disease, Seizures, Stroke, Thyroid  Problems, Tuberculosis. Social History Former smoker - 10 years, Marital Status - Widowed, Alcohol Use - Never, Drug Use - No History, Caffeine Use - Daily. Medical History Eyes Patient has history of Cataracts - removed Denies history of Glaucoma, Optic Neuritis Ear/Nose/Mouth/Throat Denies history of Chronic sinus problems/congestion, Middle ear problems Hematologic/Lymphatic Denies history of Anemia, Hemophilia, Human Immunodeficiency Virus, Lymphedema, Sickle Cell Disease Respiratory Balaguer, Kariel ([id number]) Denies history of Aspiration, Asthma, Chronic Obstructive Pulmonary Disease  (COPD), Pneumothorax, Sleep Apnea, Tuberculosis Cardiovascular Patient has history of Arrhythmia - a fib, Congestive Heart Failure, Hypertension Denies history of Angina, Coronary Artery Disease, Deep Vein Thrombosis, Hypotension, Myocardial Infarction, Peripheral Arterial Disease, Peripheral Venous Disease, Phlebitis, Vasculitis Gastrointestinal Denies history of Cirrhosis , Colitis, Crohn s, Hepatitis A, Hepatitis B, Hepatitis C Endocrine Denies history of Type I Diabetes, Type II Diabetes Genitourinary Denies history of End Stage Renal Disease Immunological Denies history of Lupus Erythematosus, Raynaud s, Scleroderma Integumentary (Skin) Denies history of History of Burn, History of pressure wounds Musculoskeletal Denies history of Gout, Rheumatoid Arthritis, Osteoarthritis, Osteomyelitis Neurologic Denies history of Dementia, Neuropathy, Quadriplegia, Paraplegia, Seizure Disorder Oncologic Denies history of Received Chemotherapy, Received Radiation Psychiatric Denies history of Anorexia/bulimia, Confinement Anxiety Medical And Surgical History Notes Neurologic TIA Review of Systems (ROS) Constitutional Symptoms (General Health) The patient has no complaints or symptoms. Eyes The patient has no complaints or symptoms. Ear/Nose/Mouth/Throat The patient has no complaints or symptoms. Hematologic/Lymphatic The patient has no complaints or symptoms. Respiratory The patient has no complaints or symptoms. Cardiovascular Complains or has symptoms of LE edema. Denies complaints or symptoms of Chest pain. Gastrointestinal The patient has no complaints or symptoms. Endocrine Complains or has symptoms of Thyroid disease. Denies complaints or symptoms of Hepatitis, Polydypsia (Excessive Thirst). Genitourinary Complains or has symptoms of Incontinence/dribbling. Denies complaints or symptoms of Kidney failure/ Dialysis. Immunological The patient has no complaints or  symptoms. Integumentary (Skin) Complains or has symptoms of Wounds, Swelling. Denies complaints or symptoms of Bleeding or bruising tendency, Breakdown. Musculoskeletal The patient has no complaints or symptoms. Neurologic The patient has no complaints or symptoms. MILANIE, ROSENFIELD ([id number]) Oncologic The patient has no complaints or symptoms. Psychiatric The patient has no complaints or symptoms. Objective Constitutional Vitals Time Taken: 8:17 AM, Height: 62 in, Source: Measured, Weight: 112 lbs, Source: Measured, BMI: 20.5, Temperature: 97.9 F, Pulse: 81 bpm, Respiratory Rate: 16 breaths/min, Blood Pressure: 145/79 mmHg. Respiratory respirations are even and unlabored. clear throughout. Cardiovascular s1 s2 irregular rhythm, controlled rate. BLE- palpable DP, PT. BLE- warm to touch, non-pitting edema, reticular/varicose/spider veins present. Musculoskeletal ambulated with walker assistance. Psychiatric unclear baseline d/t anxiety and fear during this appointment. oriented x4. anxious, scared, resistant to intervention/debridement. Integumentary (Hair, Skin) Wound #1 status is Open. Original cause of wound was Gradually Appeared. The wound is located on the Right,Lateral Lower Leg. The wound measures 0.8cm length x 0.6cm width x 0.1cm depth; 0.377cm^2 area and 0.038cm^3 volume. There is Fat Layer (Subcutaneous Tissue) Exposed exposed. There is no tunneling or undermining noted. There is a large amount of serous drainage noted. The wound margin is flat and intact. There is small (1-33%) pink granulation within the wound bed. There is a large (67-100%) amount of necrotic tissue within the wound bed including Adherent Slough. The periwound skin appearance exhibited: Hemosiderin Staining. The periwound skin appearance did not exhibit: Callus, Crepitus, Excoriation, Induration, Rash, Scarring, Dry/Scaly, Maceration, Atrophie Blanche, Cyanosis, Ecchymosis, Mottled, Pallor,  Rubor, Erythema. Periwound temperature was noted as No Abnormality. The periwound has tenderness  on palpation. Wound #2 status is Open. Original cause of wound was Gradually Appeared. The wound is located on the Right,Posterior Lower Leg. The wound measures 2.3cm length x 2cm width x 0.1cm depth; 3.613cm^2 area and 0.361cm^3 volume. There is Fat Layer (Subcutaneous Tissue) Exposed exposed. There is no tunneling or undermining noted. There is a large amount of serous drainage noted. The wound margin is flat and intact. There is small (1-33%) pink, hyper - granulation within the wound bed. There is a large (67-100%) amount of necrotic tissue within the wound bed including Adherent Slough. The periwound skin appearance exhibited: Hemosiderin Staining. The periwound skin appearance did not exhibit: Callus, Crepitus, Excoriation, Induration, Rash, Scarring, Dry/Scaly, Maceration, Atrophie Blanche, Cyanosis, Ecchymosis, Mottled, Pallor, Rubor, Erythema. Periwound temperature was noted as No Abnormality. The periwound has tenderness on palpation. Wound #3 status is Open. Original cause of wound was Gradually Appeared. The wound is located on the Left,Anterior Lower Leg. The wound measures 1.2cm length x 1.8cm width x 0.1cm depth; 1.696cm^2 area and 0.17cm^3 volume. There is Fat Layer (Subcutaneous Tissue) Exposed exposed. There is no tunneling or undermining noted. There is a large amount of serous drainage noted. The wound margin is flat and intact. There is small (1-33%) red granulation within the wound bed. [facility], [place] ([id number]) There is a large (67-100%) amount of necrotic tissue within the wound bed including Adherent Slough. The periwound skin appearance exhibited: Hemosiderin Staining. The periwound skin appearance did not exhibit: Callus, Crepitus, Excoriation, Induration, Rash, Scarring, Dry/Scaly, Maceration, Atrophie Blanche, Cyanosis, Ecchymosis, Mottled, Pallor, Rubor, Erythema.  Periwound temperature was noted as No Abnormality. The periwound has tenderness on palpation. 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 Cleanse wound with mild soap and water Wound #2 Right,Posterior Lower Leg: Clean wound with Normal Saline. Cleanse wound with mild soap and water Wound #3 Left,Anterior Lower Leg: Clean wound with Normal Saline. Cleanse wound with mild soap and water Anesthetic (add to Medication List): Wound #1 Right,Lateral Lower Leg: Topical Lidocaine 4% cream applied to wound bed prior to debridement (In Clinic Only). Wound #2 Right,Posterior Lower Leg: Topical Lidocaine 4% cream applied to wound bed prior to debridement (In Clinic Only). Wound #3 Left,Anterior Lower Leg: Topical Lidocaine 4% cream applied to wound bed prior to debridement (In Clinic Only). Primary Wound Dressing: Wound #1 Right,Lateral Lower Leg: Hydrafera Blue Ready Transfer Wound #2 Right,Posterior Lower Leg: Hydrafera Blue Ready Transfer Wound #3 Left,Anterior Lower Leg: Hydrafera Blue Ready Transfer Secondary Dressing: Wound #1 Right,Lateral Lower Leg: ABD pad Wound #2 Right,Posterior Lower Leg: ABD pad Wound #3 Left,Anterior Lower Leg: ABD pad Dressing Change Frequency: Wound #1 Right,Lateral Lower Leg: Three times weekly - HHRN to change Saturday, Tuesday and pt will come to Wound Care Clinic on Thursday. KHALISE, BILLARD ([id number]) Wound #2 Right,Posterior Lower Leg: Three times weekly - HHRN to change Saturday, Tuesday and pt will come to Wound Care Clinic on Thursday. Wound #3 Left,Anterior Lower Leg: Three times weekly - HHRN to change Saturday, Tuesday and pt will come  to Wound Care Clinic on Thursday. Follow-up Appointments: Wound #1 Right,Lateral Lower Leg: Return Appointment in 1 week. Wound #2 Right,Posterior Lower Leg: Return Appointment in 1 week. Wound #3 Left,Anterior Lower Leg: Return Appointment in 1  week. Edema Control: Wound #1 Right,Lateral Lower Leg: 3 Layer Compression System - Bilateral - unna to anchor Elevate legs to the level of the heart and pump ankles as often as possible Wound #2 Right,Posterior Lower Leg: 3 Layer Compression System - Bilateral - unna to anchor Elevate legs to the level of the heart and pump ankles as often as possible Wound #3 Left,Anterior Lower Leg: 3 Layer Compression System - Bilateral - unna to anchor Elevate legs to the level of the heart and pump ankles as often as possible Additional Orders / Instructions: Wound #1 Right,Lateral Lower Leg: Increase protein intake. Wound #2 Right,Posterior Lower Leg: Increase protein intake. Wound #3 Left,Anterior Lower Leg: Increase protein intake. Home Health: Wound #1 Right,Lateral Lower Leg: Initiate Home Health for Skilled Nursing Continue Home Health Visits Home Health Nurse may visit PRN to address patient s wound care needs. FACE TO FACE ENCOUNTER: MEDICARE and MEDICAID PATIENTS: I certify that this patient is under my care and that I had a face-to-face encounter that meets the physician face-to-face encounter requirements with this patient on this date. The encounter with the patient was in whole or in part for the following MEDICAL CONDITION: (primary reason for Home Healthcare) MEDICAL NECESSITY: I certify, that based on my findings, NURSING services are a medically necessary home health service. HOME BOUND STATUS: I certify that my clinical findings support that this patient is homebound (i.e., Due to illness or injury, pt requires aid of supportive devices such as crutches, cane, wheelchairs, walkers, the use of special transportation or the assistance  of another person to leave their place of residence. There is a normal inability to leave the home and doing so requires considerable and taxing effort. Other absences are for medical reasons / religious services and are infrequent or of short duration when for other reasons). If current dressing causes regression in wound condition, may D/C ordered dressing product/s and apply Normal Saline Moist Dressing daily until next Wound Healing Center / Other MD appointment. Notify Wound Healing Center of regression in wound condition at [phone]. Please direct any NON-WOUND related issues/requests for orders to patient's Primary Care Physician Wound #2 Right,Posterior Lower Leg: Initiate Home Health for Skilled Nursing Continue Home Health Visits Home Health Nurse may visit PRN to address patient s wound care needs. FACE TO FACE ENCOUNTER: MEDICARE and MEDICAID PATIENTS: I certify that this patient is under my care and that I had a face-to-face encounter that meets the physician face-to-face encounter requirements with this patient on this date. The encounter with the patient was in whole or in part for the following MEDICAL CONDITION: (primary reason for Home Healthcare) MEDICAL NECESSITY: I certify, that based on my findings, NURSING services are a medically necessary home health service. HOME BOUND STATUS: I certify that my clinical findings support that this patient is homebound (i.e., Due to illness or injury, pt requires aid of supportive devices such as crutches, cane, wheelchairs, walkers, the use of special transportation or the assistance of another person to leave their place of residence. There is a normal inability to leave the home and doing so requires considerable and taxing effort. Other absences are for medical reasons / religious services and are infrequent or of short duration when for other reasons). If current dressing causes regression in wound condition, may D/C ordered  dressing product/s and apply Normal Saline Crouse, Mardy ([id number]) Moist Dressing daily until next Wound Healing Center / Other MD appointment. Notify Wound Healing Center of regression in wound  condition at [phone]. Please direct any NON-WOUND related issues/requests for orders to patient's Primary Care Physician Wound #3 Left,Anterior Lower Leg: Initiate Home Health for Skilled Nursing Continue Home Health Visits Home Health Nurse may visit PRN to address patient s wound care needs. FACE TO FACE ENCOUNTER: MEDICARE and MEDICAID PATIENTS: I certify that this patient is under my care and that I had a face-to-face encounter that meets the physician face-to-face encounter requirements with this patient on this date. The encounter with the patient was in whole or in part for the following MEDICAL CONDITION: (primary reason for Home Healthcare) MEDICAL NECESSITY: I certify, that based on my findings, NURSING services are a medically necessary home health service. HOME BOUND STATUS: I certify that my clinical findings support that this patient is homebound (i.e., Due to illness or injury, pt requires aid of supportive devices such as crutches, cane, wheelchairs, walkers, the use of special transportation or the assistance of another person to leave their place of residence. There is a normal inability to leave the home and doing so requires considerable and taxing effort. Other absences are for medical reasons / religious services and are infrequent or of short duration when for other reasons). If current dressing causes regression in wound condition, may D/C ordered dressing product/s and apply Normal Saline Moist Dressing daily until next Wound Healing Center / Other MD appointment. Notify Wound Healing Center of regression in wound condition at [phone]. Please direct any NON-WOUND related issues/requests for orders to patient's Primary Care Physician The following medication(s) was  prescribed: lidocaine topical 4 % cream 1 1 cream topical was prescribed at facility Electronic Signature(s) Signed: 05/13/2017 10:49:41 AM By: Bonnell Public Previous Signature: 05/13/2017 10:25:41 AM Version By: Bonnell Public Entered By: Bonnell Public on 05/13/2017 10:49:41 Lydia Buchanan ([id number]) -------------------------------------------------------------------------------- ROS/PFSH Details Patient Name: Lydia Buchanan Date of Service: 05/13/2017 8:00 AM Medical Record Number: [id number] Patient Account Number: [id number] Date of Birth/Sex: 06/08/1932 (84 y.o. F) Treating RN: Curtis Sites Primary Care Provider: PATIENT, NO Other Clinician: Referring Provider: Franchot Mimes Treating Provider/Extender: Kathreen Cosier in Treatment: 0 Information Obtained From Patient Wound History Do you currently have one or more open woundso Yes How many open wounds do you currently haveo 2 Approximately how long have you had your woundso 6 weeks How have you been treating your wound(s) until nowo unknown Has your wound(s) ever healed and then re-openedo No Have you had any lab work done in the past montho No Have you tested positive for an antibiotic resistant organism (MRSA, VRE)o No Have you tested positive for osteomyelitis (bone infection)o No Have you had any tests for circulation on your legso No Constitutional Symptoms (General Health) Complaints and Symptoms: No Complaints or Symptoms Complaints and Symptoms: Negative for: Fatigue; Fever; Chills; Marked Weight Change Eyes Complaints and Symptoms: No Complaints or Symptoms Complaints and Symptoms: Negative for: Dry Eyes; Vision Changes; Glasses / Contacts Medical History: Positive for: Cataracts - removed Negative for: Glaucoma; Optic Neuritis Ear/Nose/Mouth/Throat Complaints and Symptoms: No Complaints or Symptoms Complaints and Symptoms: Negative for: Difficult clearing ears; Sinusitis Medical History: Negative for:  Chronic sinus problems/congestion; Middle ear problems Hematologic/Lymphatic Complaints and Symptoms: No Complaints or Symptoms Complaints and Symptoms: Gilroy, Abiha ([id number]) Negative for: Bleeding / Clotting Disorders; Human Immunodeficiency Virus Medical History: Negative for: Anemia; Hemophilia; Human Immunodeficiency Virus; Lymphedema; Sickle Cell Disease Respiratory Complaints and Symptoms: No Complaints or Symptoms Complaints and Symptoms: Negative for: Chronic or frequent coughs; Shortness of Breath Medical History: Negative for: Aspiration; Asthma;  Chronic Obstructive Pulmonary Disease (COPD); Pneumothorax; Sleep Apnea; Tuberculosis Cardiovascular Complaints and Symptoms: Positive for: LE edema Negative for: Chest pain Medical History: Positive for: Arrhythmia - a fib; Congestive Heart Failure; Hypertension Negative for: Angina; Coronary Artery Disease; Deep Vein Thrombosis; Hypotension; Myocardial Infarction; Peripheral Arterial Disease; Peripheral Venous Disease; Phlebitis; Vasculitis Gastrointestinal Complaints and Symptoms: No Complaints or Symptoms Complaints and Symptoms: Negative for: Frequent diarrhea; Nausea; Vomiting Medical History: Negative for: Cirrhosis ; Colitis; Crohnos; Hepatitis A; Hepatitis B; Hepatitis C Endocrine Complaints and Symptoms: Positive for: Thyroid disease Negative for: Hepatitis; Polydypsia (Excessive Thirst) Medical History: Negative for: Type I Diabetes; Type II Diabetes Genitourinary Complaints and Symptoms: Positive for: Incontinence/dribbling Negative for: Kidney failure/ Dialysis Medical History: Negative for: End Stage Renal Disease Immunological STINA, GANE ([id number]) Complaints and Symptoms: No Complaints or Symptoms Complaints and Symptoms: Negative for: Hives; Itching Medical History: Negative for: Lupus Erythematosus; Raynaudos; Scleroderma Integumentary (Skin) Complaints and Symptoms: Positive  for: Wounds; Swelling Negative for: Bleeding or bruising tendency; Breakdown Medical History: Negative for: History of Burn; History of pressure wounds Musculoskeletal Complaints and Symptoms: No Complaints or Symptoms Complaints and Symptoms: Negative for: Muscle Pain; Muscle Weakness Medical History: Negative for: Gout; Rheumatoid Arthritis; Osteoarthritis; Osteomyelitis Neurologic Complaints and Symptoms: No Complaints or Symptoms Complaints and Symptoms: Negative for: Numbness/parasthesias; Focal/Weakness Medical History: Negative for: Dementia; Neuropathy; Quadriplegia; Paraplegia; Seizure Disorder Past Medical History Notes: TIA Psychiatric Complaints and Symptoms: No Complaints or Symptoms Complaints and Symptoms: Negative for: Anxiety; Claustrophobia Medical History: Negative for: Anorexia/bulimia; Confinement Anxiety Oncologic Complaints and Symptoms: No Complaints or Symptoms Medical HistoryMARISAH, LAKER ([id number]) Negative for: Received Chemotherapy; Received Radiation HBO Extended History Items Eyes: Cataracts Immunizations Pneumococcal Vaccine: Received Pneumococcal Vaccination: Yes Implantable Devices Family and Social History Cancer: Yes - Child; Diabetes: No; Heart Disease: Yes - Mother,Father; Hereditary Spherocytosis: No; Hypertension: Yes - Mother,Father; Kidney Disease: No; Lung Disease: No; Seizures: No; Stroke: No; Thyroid Problems: No; Tuberculosis: No; Former smoker - 10 years; Marital Status - Widowed; Alcohol Use: Never; Drug Use: No History; Caffeine Use: Daily; Financial Concerns: No; Food, Clothing or Shelter Needs: No; Support System Lacking: No; Transportation Concerns: No; Advanced Directives: No; Patient does not want information on Advanced Directives Electronic Signature(s) Signed: 05/13/2017 12:54:19 PM By: Bonnell Public Signed: 05/13/2017 1:59:05 PM By: Curtis Sites Entered By: Curtis Sites on 05/13/2017 08:15:18 Lydia Buchanan ([id number]) -------------------------------------------------------------------------------- SuperBill Details Patient Name: Lydia Buchanan Date of Service: 05/13/2017 Medical Record Number: [id number] Patient Account Number: [id number] Date of Birth/Sex: [birth year] (84 y.o. 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 layer exposed Facility Procedures CPT4 Code: 41324401 Description: [phone] - WOUND CARE VISIT-LEV 5 EST PT Modifier: Quantity: 1 Physician Procedures CPT4 Code: [id number] Description: WC PHYS LEVEL 3 o NEW PT ICD-10 Diagnosis Description L97.212 Non-pressure chronic ulcer of right calf with fat layer e L97.222 Non-pressure chronic ulcer of left calf with fat layer ex Modifier: xposed posed Quantity: 1 Electronic Signature(s) Signed: 05/13/2017 12:42:20 PM By: Alejandro Mulling Signed: 05/13/2017 12:54:19 PM By: Bonnell Public Previous Signature: 05/13/2017 12:40:50 PM Version By: Alejandro Mulling Previous Signature: 05/13/2017 10:25:54 AM Version By: Bonnell Public Entered By: Alejandro Mulling on 05/13/2017 12:42:20

## 2017-05-18 NOTE — Progress Notes (Signed)
Lydia Buchanan, Lydia Buchanan ([id number]) Visit Report for 05/13/2017 Allergy List Details Patient Name: Lydia Buchanan, Lydia Buchanan Date of Service: 05/13/2017 8:00 AM Medical Record Number: [id number] Patient Account Number: [id number] Date of Birth/Sex: 12-May-1932 (84 y.o. F) Treating RN: Curtis Sites Primary Care Demontray Franta: SYSTEM, PCP Other Clinician: Referring Arianna Haydon: Franchot Mimes Treating Rianne Degraaf/Extender: Kathreen Cosier in Treatment: 0 Allergies Active Allergies Macrobid oxycodone Allergy Notes Electronic Signature(s) Signed: 05/13/2017 1:59:05 PM By: Curtis Sites Entered By: Curtis Sites on 05/13/2017 08:09:33 Lydia Buchanan ([id number]) -------------------------------------------------------------------------------- Arrival Information Details Patient Name: Lydia Buchanan Date of Service: 05/13/2017 8:00 AM Medical Record Number: [id number] Patient Account Number: [id number] Date of Birth/Sex: 1932/09/30 (84 y.o. F) Treating RN: Curtis Sites Primary Care Nyonna Hargrove: SYSTEM, PCP Other Clinician: Referring Shellyann Wandrey: Franchot Mimes Treating Brodie Correll/Extender: Kathreen Cosier in Treatment: 0 Visit Information Patient Arrived: Walker Arrival Time: 08:08 Accompanied By: Shelle Iron Transfer Assistance: None Patient Identification Verified: Yes Secondary Verification Process Completed: Yes Electronic Signature(s) Signed: 05/13/2017 1:59:05 PM By: Curtis Sites Entered By: Curtis Sites on 05/13/2017 08:08:54 Lydia Buchanan ([id number]) -------------------------------------------------------------------------------- Clinic Level of Care Assessment Details Patient Name: Lydia Buchanan Date of Service: 05/13/2017 8:00 AM Medical Record Number: [id number] Patient Account Number: [id number] Date of Birth/Sex: [birth year] (84 y.o. F) Treating RN: Phillis Haggis Primary Care Lenee Franze: SYSTEM, PCP Other Clinician: Referring Clarke Amburn: Franchot Mimes Treating Charlestine Rookstool/Extender: Kathreen Cosier in Treatment: 0 Clinic Level of Care Assessment Items TOOL 2 Quantity Score X - Use when only an EandM is performed on the INITIAL visit 1 0 ASSESSMENTS - Nursing Assessment / Reassessment X - General Physical Exam (combine w/ comprehensive assessment (listed just below) when 1 20 performed on new pt. evals) X- 1 25 Comprehensive Assessment (HX, ROS, Risk Assessments, Wounds Hx, etc.) ASSESSMENTS - Wound and Skin Assessment / Reassessment  - Simple Wound Assessment / Reassessment - one wound 0 X- 3 5 Complex Wound Assessment / Reassessment - multiple wounds  - 0 Dermatologic / Skin Assessment (not related to wound area) ASSESSMENTS - Ostomy and/or Continence Assessment and Care  - Incontinence Assessment and Management 0  - 0 Ostomy Care Assessment and Management (repouching, etc.) PROCESS - Coordination of Care  - Simple Patient / Family Education for ongoing care 0 X- 1 20 Complex (extensive) Patient / Family Education for ongoing care X- 1 10 Staff obtains Consents, Records, Test Results / Process Orders X- 1 10 Staff telephones HHA, Nursing Homes / Clarify orders / etc  - 0 Routine Transfer to another Facility (non-emergent condition)  - 0 Routine Hospital Admission (non-emergent condition) X- 1 15 New Admissions / Manufacturing engineer / Ordering NPWT, Apligraf, etc.  - 0 Emergency Hospital Admission (emergent condition) X- 1 10 Simple Discharge Coordination  - 0 Complex (extensive) Discharge Coordination PROCESS - Special Needs  - Pediatric / Minor Patient Management 0  - 0 Isolation Patient Management EDWARD, TREVINO ([id number])  - 0 Hearing / Language / Visual special needs  - 0 Assessment of Community assistance (transportation, D/C planning, etc.)  - 0 Additional assistance / Altered mentation  - 0 Support Surface(s) Assessment (bed, cushion, seat, etc.) INTERVENTIONS - Wound Cleansing / Measurement X -  Wound Imaging (photographs - any number of wounds) 1 5  - 0 Wound Tracing (instead of photographs)  - 0 Simple Wound Measurement - one wound X- 3 5 Complex Wound Measurement - multiple wounds  - 0 Simple Wound Cleansing - one wound X- 3 5 Complex Wound Cleansing - multiple wounds INTERVENTIONS - Wound Dressings  - Small Wound  Dressing one or multiple wounds 0  - 0 Medium Wound Dressing one or multiple wounds X- 2 20 Large Wound Dressing one or multiple wounds  - 0 Application of Medications - injection INTERVENTIONS - Miscellaneous  - External ear exam 0  - 0 Specimen Collection (cultures, biopsies, blood, body fluids, etc.)  - 0 Specimen(s) / Culture(s) sent or taken to Lab for analysis  - 0 Patient Transfer (multiple staff / Nurse, adult / Similar devices)  - 0 Simple Staple / Suture removal (25 or less)  - 0 Complex Staple / Suture removal (26 or more)  - 0 Hypo / Hyperglycemic Management (close monitor of Blood Glucose) X- 1 15 Ankle / Brachial Index (ABI) - do not check if billed separately Has the patient been seen at the hospital within the last three years: Yes Total Score: 215 Level Of Care: New/Established - Level 5 Electronic Signature(s) Signed: 05/14/2017 4:40:49 PM By: Alejandro Mulling Entered By: Alejandro Mulling on 05/13/2017 12:42:11 Lydia Buchanan ([id number]) -------------------------------------------------------------------------------- Encounter Discharge Information Details Patient Name: Lydia Buchanan Date of Service: 05/13/2017 8:00 AM Medical Record Number: [id number] Patient Account Number: [id number] Date of Birth/Sex: 06-Dec-1932 (84 y.o. F) Treating RN: Phillis Haggis Primary Care Keiona Jenison: SYSTEM, PCP Other Clinician: Referring Scout Guyett: Franchot Mimes Treating Lonya Johannesen/Extender: Kathreen Cosier in Treatment: 0 Encounter Discharge Information Items Discharge Pain Level: 0 Discharge Condition:  Stable Ambulatory Status: Unstable Discharge Destination: Home Transportation: Private Auto Schedule Follow-up Appointment: No Medication Reconciliation completed and No provided to Patient/Care Makailah Slavick: Provided on Clinical Summary of Care: 05/13/2017 Form Type Recipient Paper Patient MR Electronic Signature(s) Signed: 05/13/2017 12:42:09 PM By: Renne Crigler Entered By: Renne Crigler on 05/13/2017 09:31:33 Lydia Buchanan ([id number]) -------------------------------------------------------------------------------- Lower Extremity Assessment Details Patient Name: Lydia Buchanan Date of Service: 05/13/2017 8:00 AM Medical Record Number: [id number] Patient Account Number: [id number] Date of Birth/Sex: 07-04-1932 (84 y.o. F) Treating RN: Curtis Sites Primary Care Kadee Philyaw: SYSTEM, PCP Other Clinician: Referring Trig Mcbryar: Franchot Mimes Treating Lynard Postlewait/Extender: Kathreen Cosier in Treatment: 0 Edema Assessment Assessed: [Left: No] [Right: No] Edema: [Left: Yes] [Right: Yes] Calf Left: Right: Point of Measurement: 32 cm From Medial Instep 33.7 cm 33 cm Ankle Left: Right: Point of Measurement: 10 cm From Medial Instep 21.5 cm 20.7 cm Vascular Assessment Pulses: Dorsalis Pedis Palpable: [Left:Yes] [Right:Yes] Doppler Audible: [Left:Yes] [Right:Yes] Posterior Tibial Palpable: [Left:Yes] [Right:Yes] Doppler Audible: [Left:Yes] [Right:Yes] Extremity colors, hair growth, and conditions: Extremity Color: [Left:Hyperpigmented] [Right:Hyperpigmented] Hair Growth on Extremity: [Left:Yes] [Right:Yes] Temperature of Extremity: [Left:Warm] [Right:Warm] Capillary Refill: [Left:< 3 seconds] [Right:< 3 seconds] Blood Pressure: Brachial: [Left:138] [Right:136] Dorsalis Pedis: 98 [Left:Dorsalis Pedis: 122] Ankle: Posterior Tibial: 118 [Left:Posterior Tibial: 96 0.86] [Right:0.88] Toe Nail Assessment Left: Right: Thick: Yes Yes Discolored: No No Deformed: No  No Improper Length and Hygiene: No No Electronic Signature(s) Signed: 05/13/2017 1:59:05 PM By: Curtis Sites Entered By: Curtis Sites on 05/13/2017 08:42:13 Lydia Buchanan ([id number]) Lydia Buchanan, Lydia Buchanan ([id number]) -------------------------------------------------------------------------------- Multi Wound Chart Details Patient Name: Lydia Buchanan Date of Service: 05/13/2017 8:00 AM Medical Record Number: [id number] Patient Account Number: [id number] Date of Birth/Sex: 01-Sep-1932 (84 y.o. F) Treating RN: Phillis Haggis Primary Care Dequon Schnebly: SYSTEM, PCP Other Clinician: Referring Olivia Pavelko: Franchot Mimes Treating Sumie Remsen/Extender: Kathreen Cosier in Treatment: 0 Vital Signs Height(in): 62 Pulse(bpm): 81 Weight(lbs): 112 Blood Pressure(mmHg): 145/79 Body Mass Index(BMI): 20 Temperature(F): 97.9 Respiratory Rate 16 (breaths/min): Photos: Wound Location: Right Lower Leg - Lateral Right Lower Leg - Posterior Left Lower Leg - Anterior Wounding Event: Gradually Appeared Gradually Appeared Gradually Appeared Primary  Etiology: Venous Leg Ulcer Venous Leg Ulcer Venous Leg Ulcer Comorbid History: Cataracts, Arrhythmia, Cataracts, Arrhythmia, Cataracts, Arrhythmia, Congestive Heart Failure, Congestive Heart Failure, Congestive Heart Failure, Hypertension Hypertension Hypertension Date Acquired: 03/29/2017 03/29/2017 03/29/2017 Weeks of Treatment: 0 0 0 Wound Status: Open Open Open Measurements L x W x D 0.8x0.6x0.1 2.3x2x0.1 1.2x1.8x0.1 (cm) Area (cm) : 0.377 3.613 1.696 Volume (cm) : 0.038 0.361 0.17 Classification: Full Thickness Without Full Thickness Without Full Thickness Without Exposed Support Structures Exposed Support Structures Exposed Support Structures Exudate Amount: Large Large Large Exudate Type: Serous Serous Serous Exudate Color: amber amber amber Wound Margin: Flat and Intact Flat and Intact Flat and Intact Granulation Amount: Small (1-33%) Small  (1-33%) Small (1-33%) Granulation Quality: Pink Pink, Hyper-granulation Red Necrotic Amount: Large (67-100%) Large (67-100%) Large (67-100%) Exposed Structures: Fat Layer (Subcutaneous Fat Layer (Subcutaneous Fat Layer (Subcutaneous Tissue) Exposed: Yes Tissue) Exposed: Yes Tissue) Exposed: Yes Fascia: No Fascia: No Fascia: No Tendon: No Tendon: No Tendon: No Muscle: No Muscle: No Muscle: No Joint: No Joint: No Joint: No Bone: No Bone: No Bone: No Lydia Buchanan, Lydia Buchanan ([id number]) Epithelialization: None None None Periwound Skin Texture: Excoriation: No Excoriation: No Excoriation: No Induration: No Induration: No Induration: No Callus: No Callus: No Callus: No Crepitus: No Crepitus: No Crepitus: No Rash: No Rash: No Rash: No Scarring: No Scarring: No Scarring: No Periwound Skin Moisture: Maceration: No Maceration: No Maceration: No Dry/Scaly: No Dry/Scaly: No Dry/Scaly: No Periwound Skin Color: Hemosiderin Staining: Yes Hemosiderin Staining: Yes Hemosiderin Staining: Yes Atrophie Blanche: No Atrophie Blanche: No Atrophie Blanche: No Cyanosis: No Cyanosis: No Cyanosis: No Ecchymosis: No Ecchymosis: No Ecchymosis: No Erythema: No Erythema: No Erythema: No Mottled: No Mottled: No Mottled: No Pallor: No Pallor: No Pallor: No Rubor: No Rubor: No Rubor: No Temperature: No Abnormality No Abnormality No Abnormality Tenderness on Palpation: Yes Yes Yes Wound Preparation: Ulcer Cleansing: Ulcer Cleansing: Ulcer Cleansing: Rinsed/Irrigated with Saline Rinsed/Irrigated with Saline Rinsed/Irrigated with Saline Topical Anesthetic Applied: Topical Anesthetic Applied: Topical Anesthetic Applied: Other: lidocaine 4% Other: lidocaine 4% Other: lidocaine 4% Treatment Notes Wound #1 (Right, Lateral Lower Leg) 1. Cleansed with: Clean wound with Normal Saline 2. Anesthetic Topical Lidocaine 4% cream to wound bed prior to debridement 4. Dressing  Applied: Hydrafera Blue 5. Secondary Dressing Applied ABD Pad 7. Secured with 3 Layer Compression System - Bilateral Notes unna boot to anchor Wound #2 (Right, Posterior Lower Leg) 1. Cleansed with: Clean wound with Normal Saline 2. Anesthetic Topical Lidocaine 4% cream to wound bed prior to debridement 4. Dressing Applied: Hydrafera Blue 5. Secondary Dressing Applied ABD Pad 7. Secured with 3 Layer Compression System - Bilateral Notes unna boot to anchor Lydia Buchanan, Lydia Buchanan ([id number]) Wound #3 (Left, Anterior Lower Leg) 1. Cleansed with: Clean wound with Normal Saline 2. Anesthetic Topical Lidocaine 4% cream to wound bed prior to debridement 4. Dressing Applied: Hydrafera Blue 5. Secondary Dressing Applied ABD Pad 7. Secured with 3 Layer Compression System - Bilateral Notes unna boot to Ecologist) Signed: 05/13/2017 10:13:03 AM By: Bonnell Public Entered By: Bonnell Public on 05/13/2017 10:13:03 Lydia Buchanan ([id number]) -------------------------------------------------------------------------------- Multi-Disciplinary Care Plan Details Patient Name: Lydia Buchanan Date of Service: 05/13/2017 8:00 AM Medical Record Number: [id number] Patient Account Number: [id number] Date of Birth/Sex: 07/29/1932 (84 y.o. F) Treating RN: Phillis Haggis Primary Care Shera Laubach: SYSTEM, PCP Other Clinician: Referring Trinaty Bundrick: Franchot Mimes Treating Andjela Wickes/Extender: Kathreen Cosier in Treatment: 0 Active Inactive ` Abuse / Safety / Falls / Self Care Management Nursing Diagnoses: Potential for falls  Goals: Patient will not experience any injury related to falls Date Initiated: 05/13/2017 Target Resolution Date: 08/21/2017 Goal Status: Active Interventions: Assess Activities of Daily Living upon admission and as needed Assess fall risk on admission and as needed Assess: immobility, friction, shearing, incontinence upon admission and as needed Assess  impairment of mobility on admission and as needed per policy Assess personal safety and home safety (as indicated) on admission and as needed Assess self care needs on admission and as needed Notes: ` Nutrition Nursing Diagnoses: Imbalanced nutrition Potential for alteratiion in Nutrition/Potential for imbalanced nutrition Goals: Patient/caregiver agrees to and verbalizes understanding of need to use nutritional supplements and/or vitamins as prescribed Date Initiated: 05/13/2017 Target Resolution Date: 09/18/2017 Goal Status: Active Interventions: Assess patient nutrition upon admission and as needed per policy Notes: ` Orientation to the Wound Care Program Nursing Diagnoses: Knowledge deficit related to the wound healing center program Lydia Buchanan, Lydia Buchanan ([id number]) Goals: Patient/caregiver will verbalize understanding of the Wound Healing Center Program Date Initiated: 05/13/2017 Target Resolution Date: 06/19/2017 Goal Status: Active Interventions: Provide education on orientation to the wound center Notes: ` Pain, Acute or Chronic Nursing Diagnoses: Pain, acute or chronic: actual or potential Potential alteration in comfort, pain Goals: Patient/caregiver will verbalize adequate pain control between visits Date Initiated: 05/13/2017 Target Resolution Date: 09/18/2017 Goal Status: Active Interventions: Complete pain assessment as per visit requirements Notes: ` Wound/Skin Impairment Nursing Diagnoses: Impaired tissue integrity Knowledge deficit related to ulceration/compromised skin integrity Goals: Ulcer/skin breakdown will have a volume reduction of 80% by week 12 Date Initiated: 05/13/2017 Target Resolution Date: 09/11/2017 Goal Status: Active Interventions: Assess patient/caregiver ability to perform ulcer/skin care regimen upon admission and as needed Assess ulceration(s) every visit Notes: Electronic Signature(s) Signed: 05/14/2017 4:40:49 PM By: Alejandro Mulling Entered By: Alejandro Mulling on 05/13/2017 08:49:17 Lydia Buchanan ([id number]) -------------------------------------------------------------------------------- Pain Assessment Details Patient Name: Lydia Buchanan Date of Service: 05/13/2017 8:00 AM Medical Record Number: [id number] Patient Account Number: [id number] Date of Birth/Sex: 03-10-1932 (84 y.o. F) Treating RN: Curtis Sites Primary Care Nidya Bouyer: SYSTEM, PCP Other Clinician: Referring Yosiah Jasmin: Franchot Mimes Treating Magnolia Mattila/Extender: Kathreen Cosier in Treatment: 0 Active Problems Location of Pain Severity and Description of Pain Patient Has Paino Yes Site Locations Pain Location: Pain in Ulcers With Dressing Change: Yes Duration of the Pain. Constant / Intermittento Constant Pain Management and Medication Current Pain Management: Electronic Signature(s) Signed: 05/13/2017 1:59:05 PM By: Curtis Sites Entered By: Curtis Sites on 05/13/2017 08:09:07 Lydia Buchanan ([id number]) -------------------------------------------------------------------------------- Patient/Caregiver Education Details Patient Name: Lydia Buchanan Date of Service: 05/13/2017 8:00 AM Medical Record Number: [id number] Patient Account Number: [id number] Date of Birth/Gender: [birth year] (84 y.o. F) Treating RN: Renne Crigler Primary Care Physician: SYSTEM, PCP Other Clinician: Referring Physician: Franchot Mimes Treating Physician/Extender: Kathreen Cosier in Treatment: 0 Education Assessment Education Provided To: Patient Education Topics Provided Welcome To The Wound Care Center: Handouts: Welcome To The Wound Care Center Methods: Explain/Verbal Responses: State content correctly Wound/Skin Impairment: Handouts: Caring for Your Ulcer Methods: Explain/Verbal Responses: State content correctly Electronic Signature(s) Signed: 05/13/2017 12:42:09 PM By: Renne Crigler Entered By: Renne Crigler on 05/13/2017  09:31:52 Lydia Buchanan ([id number]) -------------------------------------------------------------------------------- Wound Assessment Details Patient Name: Lydia Buchanan Date of Service: 05/13/2017 8:00 AM Medical Record Number: [id number] Patient Account Number: [id number] Date of Birth/Sex: 1932-05-02 (84 y.o. F) Treating RN: Curtis Sites Primary Care Babak Lucus: SYSTEM, PCP Other Clinician: Referring Ramel Tobon: Franchot Mimes Treating Marlaya Turck/Extender: Kathreen Cosier in Treatment: 0 Wound Status Wound Number: 1 Primary Venous Leg Ulcer Etiology: Wound  Location: Right Lower Leg - Lateral Wound Status: Open Wounding Event: Gradually Appeared Comorbid Cataracts, Arrhythmia, Congestive Heart Date Acquired: 03/29/2017 History: Failure, Hypertension Weeks Of Treatment: 0 Clustered Wound: No Photos Photo Uploaded By: Curtis Sites on 05/13/2017 09:40:44 Wound Measurements Length: (cm) 0.8 Width: (cm) 0.6 Depth: (cm) 0.1 Area: (cm) 0.377 Volume: (cm) 0.038 % Reduction in Area: % Reduction in Volume: Epithelialization: None Tunneling: No Undermining: No Wound Description Full Thickness Without Exposed Support Classification: Structures Wound Margin: Flat and Intact Exudate Large Amount: Exudate Type: Serous Exudate Color: amber Foul Odor After Cleansing: No Slough/Fibrino Yes Wound Bed Granulation Amount: Small (1-33%) Exposed Structure Granulation Quality: Pink Fascia Exposed: No Necrotic Amount: Large (67-100%) Fat Layer (Subcutaneous Tissue) Exposed: Yes Necrotic Quality: Adherent Slough Tendon Exposed: No Muscle Exposed: No Joint Exposed: No Bone Exposed: No Lydia Buchanan, Lydia Buchanan ([id number]) Periwound Skin Texture Texture Color No Abnormalities Noted: No No Abnormalities Noted: No Callus: No Atrophie Blanche: No Crepitus: No Cyanosis: No Excoriation: No Ecchymosis: No Induration: No Erythema: No Rash: No Hemosiderin Staining: Yes Scarring:  No Mottled: No Pallor: No Moisture Rubor: No No Abnormalities Noted: No Dry / Scaly: No Temperature / Pain Maceration: No Temperature: No Abnormality Tenderness on Palpation: Yes Wound Preparation Ulcer Cleansing: Rinsed/Irrigated with Saline Topical Anesthetic Applied: Other: lidocaine 4%, Treatment Notes Wound #1 (Right, Lateral Lower Leg) 1. Cleansed with: Clean wound with Normal Saline 2. Anesthetic Topical Lidocaine 4% cream to wound bed prior to debridement 4. Dressing Applied: Hydrafera Blue 5. Secondary Dressing Applied ABD Pad 7. Secured with 3 Layer Compression System - Bilateral Notes unna boot to Ecologist) Signed: 05/13/2017 1:59:05 PM By: Curtis Sites Entered By: Curtis Sites on 05/13/2017 08:31:52 Lydia Buchanan ([id number]) -------------------------------------------------------------------------------- Wound Assessment Details Patient Name: Lydia Buchanan Date of Service: 05/13/2017 8:00 AM Medical Record Number: [id number] Patient Account Number: [id number] Date of Birth/Sex: 1932-03-28 (84 y.o. F) Treating RN: Curtis Sites Primary Care Dahlia Nifong: SYSTEM, PCP Other Clinician: Referring Cyana Shook: Franchot Mimes Treating Cerys Winget/Extender: Kathreen Cosier in Treatment: 0 Wound Status Wound Number: 2 Primary Venous Leg Ulcer Etiology: Wound Location: Right Lower Leg - Posterior Wound Status: Open Wounding Event: Gradually Appeared Comorbid Cataracts, Arrhythmia, Congestive Heart Date Acquired: 03/29/2017 History: Failure, Hypertension Weeks Of Treatment: 0 Clustered Wound: No Photos Photo Uploaded By: Curtis Sites on 05/13/2017 09:41:12 Wound Measurements Length: (cm) 2.3 Width: (cm) 2 Depth: (cm) 0.1 Area: (cm) 3.613 Volume: (cm) 0.361 % Reduction in Area: % Reduction in Volume: Epithelialization: None Tunneling: No Undermining: No Wound Description Full Thickness Without Exposed  Support Classification: Structures Wound Margin: Flat and Intact Exudate Large Amount: Exudate Type: Serous Exudate Color: amber Foul Odor After Cleansing: No Slough/Fibrino Yes Wound Bed Granulation Amount: Small (1-33%) Exposed Structure Granulation Quality: Pink, Hyper-granulation Fascia Exposed: No Necrotic Amount: Large (67-100%) Fat Layer (Subcutaneous Tissue) Exposed: Yes Necrotic Quality: Adherent Slough Tendon Exposed: No Muscle Exposed: No Joint Exposed: No Bone Exposed: No Lydia Buchanan, Lydia Buchanan ([id number]) Periwound Skin Texture Texture Color No Abnormalities Noted: No No Abnormalities Noted: No Callus: No Atrophie Blanche: No Crepitus: No Cyanosis: No Excoriation: No Ecchymosis: No Induration: No Erythema: No Rash: No Hemosiderin Staining: Yes Scarring: No Mottled: No Pallor: No Moisture Rubor: No No Abnormalities Noted: No Dry / Scaly: No Temperature / Pain Maceration: No Temperature: No Abnormality Tenderness on Palpation: Yes Wound Preparation Ulcer Cleansing: Rinsed/Irrigated with Saline Topical Anesthetic Applied: Other: lidocaine 4%, Treatment Notes Wound #2 (Right, Posterior Lower Leg) 1. Cleansed with: Clean wound with Normal Saline 2. Anesthetic Topical Lidocaine  4% cream to wound bed prior to debridement 4. Dressing Applied: Hydrafera Blue 5. Secondary Dressing Applied ABD Pad 7. Secured with 3 Layer Compression System - Bilateral Notes unna boot to Ecologist) Signed: 05/13/2017 1:59:05 PM By: Curtis Sites Entered By: Curtis Sites on 05/13/2017 08:32:54 Lydia Buchanan ([id number]) -------------------------------------------------------------------------------- Wound Assessment Details Patient Name: Lydia Buchanan Date of Service: 05/13/2017 8:00 AM Medical Record Number: [id number] Patient Account Number: [id number] Date of Birth/Sex: 07/08/1932 (84 y.o. F) Treating RN: Curtis Sites Primary Care  Zyla Dascenzo: SYSTEM, PCP Other Clinician: Referring Naryiah Schley: Franchot Mimes Treating Jovan Colligan/Extender: Kathreen Cosier in Treatment: 0 Wound Status Wound Number: 3 Primary Venous Leg Ulcer Etiology: Wound Location: Left Lower Leg - Anterior Wound Status: Open Wounding Event: Gradually Appeared Comorbid Cataracts, Arrhythmia, Congestive Heart Date Acquired: 03/29/2017 History: Failure, Hypertension Weeks Of Treatment: 0 Clustered Wound: No Photos Photo Uploaded By: Curtis Sites on 05/13/2017 09:41:13 Wound Measurements Length: (cm) 1.2 Width: (cm) 1.8 Depth: (cm) 0.1 Area: (cm) 1.696 Volume: (cm) 0.17 % Reduction in Area: % Reduction in Volume: Epithelialization: None Tunneling: No Undermining: No Wound Description Full Thickness Without Exposed Support Classification: Structures Wound Margin: Flat and Intact Exudate Large Amount: Exudate Type: Serous Exudate Color: amber Foul Odor After Cleansing: No Slough/Fibrino Yes Wound Bed Granulation Amount: Small (1-33%) Exposed Structure Granulation Quality: Red Fascia Exposed: No Necrotic Amount: Large (67-100%) Fat Layer (Subcutaneous Tissue) Exposed: Yes Necrotic Quality: Adherent Slough Tendon Exposed: No Muscle Exposed: No Joint Exposed: No Bone Exposed: No Lydia Buchanan, Lydia Buchanan ([id number]) Periwound Skin Texture Texture Color No Abnormalities Noted: No No Abnormalities Noted: No Callus: No Atrophie Blanche: No Crepitus: No Cyanosis: No Excoriation: No Ecchymosis: No Induration: No Erythema: No Rash: No Hemosiderin Staining: Yes Scarring: No Mottled: No Pallor: No Moisture Rubor: No No Abnormalities Noted: No Dry / Scaly: No Temperature / Pain Maceration: No Temperature: No Abnormality Tenderness on Palpation: Yes Wound Preparation Ulcer Cleansing: Rinsed/Irrigated with Saline Topical Anesthetic Applied: Other: lidocaine 4%, Treatment Notes Wound #3 (Left, Anterior Lower Leg) 1.  Cleansed with: Clean wound with Normal Saline 2. Anesthetic Topical Lidocaine 4% cream to wound bed prior to debridement 4. Dressing Applied: Hydrafera Blue 5. Secondary Dressing Applied ABD Pad 7. Secured with 3 Layer Compression System - Bilateral Notes unna boot to Ecologist) Signed: 05/13/2017 1:59:05 PM By: Curtis Sites Entered By: Curtis Sites on 05/13/2017 08:33:54 Lydia Buchanan ([id number]) -------------------------------------------------------------------------------- Vitals Details Patient Name: Lydia Buchanan Date of Service: 05/13/2017 8:00 AM Medical Record Number: [id number] Patient Account Number: [id number] Date of Birth/Sex: 1932/03/28 (84 y.o. F) Treating RN: Curtis Sites Primary Care Azarian Starace: SYSTEM, PCP Other Clinician: Referring Ahtziry Saathoff: Franchot Mimes Treating Daneil Beem/Extender: Kathreen Cosier in Treatment: 0 Vital Signs Time Taken: 08:17 Temperature (F): 97.9 Height (in): 62 Pulse (bpm): 81 Source: Measured Respiratory Rate (breaths/min): 16 Weight (lbs): 112 Blood Pressure (mmHg): 145/79 Source: Measured Reference Range: 80 - 120 mg / dl Body Mass Index (BMI): 20.5 Electronic Signature(s) Signed: 05/13/2017 1:59:05 PM By: Curtis Sites Entered By: Curtis Sites on 05/13/2017 08:18:38

## 2017-05-18 NOTE — ED Notes (Signed)
Pt has tried to give sample of urine twice now and BM has gotten in it. Gave pt a cup of water and will I&O

## 2017-05-18 NOTE — ED Notes (Signed)
Pt taken back to [facility], by [facility]. VSS. NAD. Discharge instructions, RX, and follow up discussed. All questions addressed.

## 2017-05-18 NOTE — ED Triage Notes (Signed)
Pt arrives to ED via [facility] from [facility] with c/o generalized abdominal pain. No c/o N/V/D. EMS reports VSS en route.

## 2017-05-20 ENCOUNTER — Encounter: Payer: Medicare Other | Admitting: Physician Assistant

## 2017-05-20 DIAGNOSIS — I83018 Varicose veins of right lower extremity with ulcer other part of lower leg: Secondary | ICD-10-CM | POA: Diagnosis not present

## 2017-05-24 NOTE — Progress Notes (Signed)
Lydia, Buchanan ([id number]) Visit Report for 05/20/2017 Chief Complaint Document Details Patient Name: Lydia Buchanan, Lydia Buchanan Date of Service: 05/20/2017 1:45 PM Medical Record Number: [id number] Patient Account Number: [id number] Date of Birth/Sex: [birth year] (84 y.o. F) Treating RN: Phillis Haggis Primary Care Provider: PATIENT, NO Other Clinician: Referring Provider: Franchot Mimes Treating Provider/Extender: STONE III, HOYT Weeks in Treatment: 1 Information Obtained from: Patient Chief Complaint She is here for evaluation of multiple bilateral lower extremity ulcers Electronic Signature(s) Signed: 05/21/2017 2:32:11 PM By: Lenda Kelp PA-C Entered By: Lenda Kelp on 05/20/2017 14:06:03 Lydia Buchanan ([id number]) -------------------------------------------------------------------------------- HPI Details Patient Name: Lydia Buchanan Date of Service: 05/20/2017 1:45 PM Medical Record Number: [id number] Patient Account Number: [id number] Date of Birth/Sex: Oct 18, 1932 (84 y.o. F) Treating RN: Phillis Haggis Primary Care Provider: PATIENT, NO Other Clinician: Referring Provider: Franchot Mimes Treating Provider/Extender: STONE III, HOYT Weeks in Treatment: 1 History of Present Illness HPI Description: 05/13/17-She is here for initial evaluation of bilateral location big ulcers. She recently moved here from [place], resides at [facility]. She states that approximately 6-8 weeks ago she developed bilateral lower extremity edema with blisters which resulted in ulcerations. She has been going to [place] [facility] and was being treated with Xeroform and thigh-high TED hose compression. She was on Eliquis for atrial fibrillation, but that was DC'd approximately 3 weeks ago by cardiology when she was hospitalized after uncontrolled bleeding from the left lower extremity ulcer. She is extremely anxious, nervous and would prefer no debridement at today's appointment; repeating  questions. We will initiate home health. She is non-diabetic and a former smoker, quit  10 years ago. According to Epic records, she has been to the emergency department twice since arrival to [facility]; visit on 4/28 for BLE pain a DVT study was done and negative on 4/30 visit her daughter requested a psych evaluation r/t manipulative behavior and extreme anxiety. 05/20/17 on evaluation today patient's wounds actually show signs of being a little bit better compared to last week's evaluation that I was not the provider see her at that point. She does have some Slough noted on the surface of the wound beds but fortunately there does not appear to be any evidence of significant infection. No fevers, chills, nausea, or vomiting noted at this time. Patient does appear to be extremely anxious. Electronic Signature(s) Signed: 05/21/2017 2:32:11 PM By: Lenda Kelp PA-C Entered By: Lenda Kelp on 05/20/2017 15:38:41 Lydia Buchanan ([id number]) -------------------------------------------------------------------------------- Physical Exam Details Patient Name: Lydia Buchanan Date of Service: 05/20/2017 1:45 PM Medical Record Number: [id number] Patient Account Number: [id number] Date of Birth/Sex: 07/05/1932 (84 y.o. F) Treating RN: Phillis Haggis Primary Care Provider: PATIENT, NO Other Clinician: Referring Provider: Franchot Mimes Treating Provider/Extender: STONE III, HOYT Weeks in Treatment: 1 Constitutional Well-nourished and well-hydrated in no acute distress. Respiratory normal breathing without difficulty. clear to auscultation bilaterally. Cardiovascular regular rate and rhythm with normal S1, S2. Psychiatric Patient is not able to cooperate in decision making regarding care. Patient is oriented to person only. patient is agitated. Notes As I cleaned the patient's wounds just with saline and gauze there was some dry skin around the border in fairly of the left lateral/anterior  lower extremity ulcer. Therefore I was able to remove a good portion of this dry skin with no complication. At roughly the 7 o'clock location however as I removed the dry skin there was a very superficial ulcer which began to bleed quite significantly at this point. Subsequently I was not able to get  this stopped with pressure and enough having to use silver nitrate to chemically cauterize this blood vessel which appear to be a vein based on the way the blood was flowing at this point. There is no pulsatile nature to the blood flow. Again this was extremely superficial. Once I did get this cauterize chemically with the silver nitrate it did not continue to bleed which was good news. She remained agitated the whole time even to the point of seeming to somewhat hyperventilate at one point. Electronic Signature(s) Signed: 05/21/2017 2:32:11 PM By: Lenda Kelp PA-C Entered By: Lenda Kelp on 05/20/2017 15:40:22 Lydia Buchanan ([id number]) -------------------------------------------------------------------------------- Physician Orders Details Patient Name: Lydia Buchanan Date of Service: 05/20/2017 1:45 PM Medical Record Number: [id number] Patient Account Number: [id number] Date of Birth/Sex: September 30, 1932 (84 y.o. 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 o Cleanse wound with mild soap and water Wound #2  Right,Posterior Lower Leg o Clean wound with Normal Saline. o Cleanse wound with mild soap and water Wound #3 Left,Anterior Lower Leg o Clean wound with Normal Saline. o Cleanse wound with mild soap and water Anesthetic (add to Medication List) Wound #1 Right,Lateral Lower Leg o Topical Lidocaine 4% cream applied to wound bed prior to debridement (In Clinic Only). Wound #2 Right,Posterior Lower Leg o Topical Lidocaine 4% cream applied to wound bed prior to debridement (In Clinic Only). Wound #3 Left,Anterior Lower Leg o Topical Lidocaine 4% cream applied to wound bed prior to debridement (In Clinic Only). Primary Wound Dressing Wound #1 Right,Lateral Lower Leg o Iodoflex Wound #2 Right,Posterior Lower Leg o Iodoflex Wound #3 Left,Anterior Lower Leg o Iodoflex - Please be careful with this wound and do not scrub this wound!!! Secondary Dressing Wound #1 Right,Lateral Lower Leg o ABD pad [place], Zaliyah ([id number]) Wound #2 Right,Posterior Lower Leg o ABD pad Wound #3 Left,Anterior Lower Leg o ABD pad Dressing Change Frequency Wound #1 Right,Lateral Lower Leg o Three times weekly - HHRN to change Saturday, Tuesday and pt will come to Wound Care Clinic on Thursday. Wound #2 Right,Posterior Lower Leg o Three times weekly - HHRN to change Saturday, Tuesday and pt will come to Wound Care Clinic on Thursday. Wound #3 Left,Anterior Lower Leg o Three times weekly - HHRN to change Saturday, Tuesday and pt will come to Wound Care Clinic on Thursday. Follow-up Appointments Wound #1 Right,Lateral Lower Leg o Return Appointment in 1 week. Wound #2 Right,Posterior Lower Leg o Return Appointment in 1 week. Wound #3 Left,Anterior Lower Leg o Return Appointment in 1 week. Edema Control Wound #1 Right,Lateral Lower Leg o 3 Layer Compression System - Bilateral - unna to anchor o Elevate legs to the level of the heart and pump ankles as often as  possible Wound #2 Right,Posterior Lower Leg o 3 Layer Compression System - Bilateral - unna to anchor o Elevate legs to the level of the heart and pump ankles as often as possible Wound #3 Left,Anterior Lower Leg o 3 Layer Compression System - Bilateral - unna to anchor [employer]  o Elevate legs to the level of the heart and pump ankles as often as possible Additional Orders / Instructions Wound #1 Right,Lateral Lower Leg o Increase protein intake. Wound #2 Right,Posterior Lower Leg o Increase protein intake. Wound #3 Left,Anterior Lower Leg o Increase protein intake. Home Health Wound #1 Right,Lateral Lower Leg o Continue Home Health Visits o Home Health Nurse may visit PRN to address patientos wound care needs. Peggs, Alvilda ([id number]) o FACE TO FACE ENCOUNTER: MEDICARE and MEDICAID PATIENTS: I certify that this patient is under my care and that I had a face-to-face encounter that meets the physician face-to-face encounter requirements with this patient on this date. The encounter with the patient was in whole or in part for the following MEDICAL CONDITION: (primary reason for Home Healthcare) MEDICAL NECESSITY: I certify, that based on my findings, NURSING services are a medically necessary home health service. HOME BOUND STATUS: I certify that my clinical findings support that this patient is homebound (i.e., Due to illness or injury, pt requires aid of supportive devices such as crutches, cane, wheelchairs, walkers, the use of special transportation or the assistance of another person to leave their place of residence. There is a normal inability to leave the home and doing so requires considerable and taxing effort. Other absences are for medical reasons / religious services and are infrequent or of short duration when for other reasons). o If current dressing causes regression in wound condition, may D/C ordered dressing product/s and apply Normal Saline Moist  Dressing daily until next Wound Healing Center / Other MD appointment. Notify Wound Healing Center of regression in wound condition at [phone]. o Please direct any NON-WOUND related issues/requests for orders to patient's Primary Care Physician Wound #2 Right,Posterior Lower Leg o Continue Home Health Visits o Home Health Nurse may visit PRN to address patientos wound care needs. o FACE TO FACE ENCOUNTER: MEDICARE and MEDICAID PATIENTS: I certify that this patient is under my care and that I had a face-to-face encounter that meets the physician face-to-face encounter requirements with this patient on this date. The encounter with the patient was in whole or in part for the following MEDICAL CONDITION: (primary reason for Home Healthcare) MEDICAL NECESSITY: I certify, that based on my findings, NURSING services are a medically necessary home health service. HOME BOUND STATUS: I certify that my clinical findings support that this patient is homebound (i.e., Due to illness or injury, pt requires aid of supportive devices such as crutches, cane, wheelchairs, walkers, the use of special transportation or the assistance of another person to leave their place of residence. There is a normal inability to leave the home and doing so requires considerable and taxing effort. Other absences are for medical reasons / religious services and are infrequent or of short duration when for other reasons). o If current dressing causes regression in wound condition, may D/C ordered dressing product/s and apply Normal Saline Moist Dressing daily until next Wound Healing Center / Other MD appointment. Notify Wound Healing Center of regression in wound condition at [phone]. o Please direct any NON-WOUND related issues/requests for orders to patient's Primary Care Physician Wound #3 Left,Anterior Lower Leg o Continue Home Health Visits o Home Health Nurse may visit PRN to address patientos  wound care needs. o FACE TO FACE ENCOUNTER: MEDICARE and MEDICAID PATIENTS: I certify that this patient is under my care and that I had a face-to-face encounter that meets the physician face-to-face encounter requirements with this patient on this date. The  encounter with the patient was in whole or in part for the following MEDICAL CONDITION: (primary reason for Home Healthcare) MEDICAL NECESSITY: I certify, that based on my findings, NURSING services are a medically necessary home health service. HOME BOUND STATUS: I certify that my clinical findings support that this patient is homebound (i.e., Due to illness or injury, pt requires aid of supportive devices such as crutches, cane, wheelchairs, walkers, the use of special transportation or the assistance of another person to leave their place of residence. There is a normal inability to leave the home and doing so requires considerable and taxing effort. Other absences are for medical reasons / religious services and are infrequent or of short duration when for other reasons). o If current dressing causes regression in wound condition, may D/C ordered dressing product/s and apply Normal Saline Moist Dressing daily until next Wound Healing Center / Other MD appointment. Notify Wound Healing Center of regression in wound condition at [phone]. o Please direct any NON-WOUND related issues/requests for orders to patient's Primary Care Physician Patient Medications Allergies: Macrobid, oxycodone Notifications Medication Indication Start End lidocaine DOSE 1 - topical 4 % cream - 1 cream topical Toole, Bernell ([id number]) Electronic Signature(s) Signed: 05/21/2017 2:32:11 PM By: Lenda Kelp PA-C Signed: 05/21/2017 4:00:06 PM By: Alejandro Mulling Entered By: Alejandro Mulling on 05/20/2017 14:29:30 Lumpkin, Claris Che ([id number]) -------------------------------------------------------------------------------- Prescription  05/20/2017 Patient Name: Lydia Buchanan Provider: Lenda Kelp PA-C Date of Birth: 09/18/1932 NPI#: [id number] Sex: F DEA#: [id number] Phone #: [phone] License #: Patient Address: [facility] [address] [facility] [address] [place], [place] [address] [place], [place] [address] [phone] Allergies Macrobid oxycodone Medication Medication: Route: Strength: Form: lidocaine topical 4% cream Class: TOPICAL LOCAL ANESTHETICS Dose: Frequency / Time: Indication: 1 1 cream topical Number of Refills: Number of Units: 0 Generic Substitution: Start Date: End Date: Administered at Substitution Permitted Facility: Yes Time Administered: Time Discontinued: Note to Pharmacy: Signature(s): Date(s): Electronic Signature(s) Signed: 05/21/2017 2:32:11 PM By: Lenda Kelp PA-C Signed: 05/21/2017 4:00:06 PM By: Orson Eva, Latrecia ([id number]) Entered By: Alejandro Mulling on 05/20/2017 14:29:31 Lydia Buchanan ([id number]) --------------------------------------------------------------------------------  Problem List Details Patient Name: Lydia Buchanan Date of Service: 05/20/2017 1:45 PM Medical Record Number: [id number] Patient Account Number: [id number] Date of Birth/Sex: [birth year] (84 y.o. F) Treating RN: Phillis Haggis Primary Care Provider: PATIENT, NO Other Clinician: Referring Provider: Franchot Mimes Treating Provider/Extender: STONE III, HOYT Weeks in Treatment: 1 Active Problems ICD-10 Impacting Encounter Code Description Active Date Wound Healing Diagnosis I83.893 Varicose veins of bilateral lower extremities with other 05/13/2017 Yes complications L97.212 Non-pressure chronic ulcer of right calf with fat layer exposed 05/13/2017 Yes L97.222 Non-pressure chronic ulcer of left calf with fat layer exposed 05/13/2017 Yes Inactive  Problems Resolved Problems Electronic Signature(s) Signed: 05/21/2017 2:32:11 PM By: Lenda Kelp PA-C Entered By: Lenda Kelp on 05/20/2017 14:05:55 Lydia Buchanan ([id number]) -------------------------------------------------------------------------------- Progress Note Details Patient Name: Lydia Buchanan Date of Service: 05/20/2017 1:45 PM Medical Record Number: [id number] Patient Account Number: [id number] Date of Birth/Sex: 10/17/1932 (84 y.o. F) Treating RN: Phillis Haggis Primary Care Provider: PATIENT, NO Other Clinician: Referring Provider: Franchot Mimes Treating Provider/Extender: STONE III, HOYT Weeks in Treatment: 1 Subjective Chief Complaint Information obtained from Patient She is here for evaluation of multiple bilateral lower extremity ulcers History of Present Illness (HPI) 05/13/17-She is here for initial evaluation of bilateral location big ulcers. She recently moved here  from [place], resides at [facility]. She states that approximately 6-8 weeks ago she developed bilateral lower extremity edema with blisters which resulted in ulcerations. She has been going to [place] wound clinic and was being treated with Xeroform and thigh-high TED hose compression. She was on Eliquis for atrial fibrillation, but that was DC'd approximately 3 weeks ago by cardiology when she was hospitalized after uncontrolled bleeding from the left lower extremity ulcer. She is extremely anxious, nervous and would prefer no debridement at today's appointment; repeating questions. We will initiate home health. She is non-diabetic and a former smoker, quit  10 years ago. According to Epic records, she has been to the emergency department twice since arrival to [facility]; visit on 4/28 for BLE pain a DVT study was done and negative on 4/30 visit her daughter requested a psych evaluation r/t manipulative behavior and extreme anxiety. 05/20/17 on evaluation today patient's wounds  actually show signs of being a little bit better compared to last week's evaluation that I was not the provider see her at that point. She does have some Slough noted on the surface of the wound beds but fortunately there does not appear to be any evidence of significant infection. No fevers, chills, nausea, or vomiting noted at this time. Patient does appear to be extremely anxious. Patient History Information obtained from Patient. Family History Cancer - Child, Heart Disease - Mother,Father, Hypertension - Mother,Father, No family history of Diabetes, Hereditary Spherocytosis, Kidney Disease, Lung Disease, Seizures, Stroke, Thyroid Problems, Tuberculosis. Social History Former smoker - 10 years, Marital Status - Widowed, Alcohol Use - Never, Drug Use - No History, Caffeine Use - Daily. Medical And Surgical History Notes Neurologic TIA Review of Systems (ROS) Constitutional Symptoms (General Health) Denies complaints or symptoms of Fever, Chills. Respiratory The patient has no complaints or symptoms. Cardiovascular Complains or has symptoms of LE edema. Gastrointestinal The patient has no complaints or symptoms. Psychiatric JAHNAE, MCADOO ([id number]) The patient has no complaints or symptoms. Objective Constitutional Well-nourished and well-hydrated in no acute distress. Vitals Time Taken: 1:59 PM, Height: 62 in, Weight: 112 lbs, BMI: 20.5, Temperature: 97.9 F, Pulse: 78 bpm, Respiratory Rate: 16 breaths/min, Blood Pressure: 119/67 mmHg. Respiratory normal breathing without difficulty. clear to auscultation bilaterally. Cardiovascular regular rate and rhythm with normal S1, S2. Psychiatric Patient is not able to cooperate in decision making regarding care. Patient is oriented to person only. patient is agitated. General Notes: As I cleaned the patient's wounds just with saline and gauze there was some dry skin around the border in fairly of the left lateral/anterior lower  extremity ulcer. Therefore I was able to remove a good portion of this dry skin with no complication. At roughly the 7 o'clock location however as I removed the dry skin there was a very superficial ulcer which began to bleed quite significantly at this point. Subsequently I was not able to get this stopped with pressure and enough having to use silver nitrate to chemically cauterize this blood vessel which appear to be a vein based on the way the blood was flowing at this point. There is no pulsatile nature to the blood flow. Again this was extremely superficial. Once I did get this cauterize chemically with the silver nitrate it did not continue to bleed which was good news. She remained agitated the whole time even to the point of seeming to somewhat hyperventilate at one point. Integumentary (Hair, Skin) Wound #1 status is Open. Original cause of wound was Gradually Appeared. The  wound is located on the Right,Lateral Lower Leg. The wound measures 0.7cm length x 0.6cm width x 0.1cm depth; 0.33cm^2 area and 0.033cm^3 volume. There is Fat Layer (Subcutaneous Tissue) Exposed exposed. There is no tunneling or undermining noted. There is a small amount of serous drainage noted. The wound margin is flat and intact. There is no granulation within the wound bed. There is a large (67-100%) amount of necrotic tissue within the wound bed including Adherent Slough. The periwound skin appearance exhibited: Hemosiderin Staining. The periwound skin appearance did not exhibit: Callus, Crepitus, Excoriation, Induration, Rash, Scarring, Dry/Scaly, Maceration, Atrophie Blanche, Cyanosis, Ecchymosis, Mottled, Pallor, Rubor, Erythema. Periwound temperature was noted as No Abnormality. The periwound has tenderness on palpation. Wound #2 status is Open. Original cause of wound was Gradually Appeared. The wound is located on the Right,Posterior Lower Leg. The wound measures 1.7cm length x 1.5cm width x 0.2cm depth;  2.003cm^2 area and 0.401cm^3 volume. There is Fat Layer (Subcutaneous Tissue) Exposed exposed. There is no tunneling or undermining noted. There is a large amount of serous drainage noted. The wound margin is flat and intact. There is small (1-33%) pink granulation within the wound bed. There is a large (67-100%) amount of necrotic tissue within the wound bed including Adherent Slough. The periwound skin appearance exhibited: Hemosiderin Staining. The periwound skin appearance did not exhibit: Callus, Crepitus, Excoriation, Induration, Rash, Scarring, Dry/Scaly, Maceration, Atrophie Blanche, Cyanosis, Ecchymosis, Mottled, Pallor, Rubor, Erythema. Periwound temperature was noted as No Abnormality. The periwound has tenderness on palpation. Wound #3 status is Open. Original cause of wound was Gradually Appeared. The wound is located on the Left,Anterior Lower Leg. The wound measures 1.2cm length x 1.6cm width x 0.1cm depth; 1.508cm^2 area and 0.151cm^3 volume. There is Fat Whitehair, Tresea ([id number]) Layer (Subcutaneous Tissue) Exposed exposed. There is no tunneling or undermining noted. There is a large amount of serous drainage noted. The wound margin is flat and intact. There is small (1-33%) red granulation within the wound bed. There is a large (67-100%) amount of necrotic tissue within the wound bed including Adherent Slough. The periwound skin appearance exhibited: Hemosiderin Staining. The periwound skin appearance did not exhibit: Callus, Crepitus, Excoriation, Induration, Rash, Scarring, Dry/Scaly, Maceration, Atrophie Blanche, Cyanosis, Ecchymosis, Mottled, Pallor, Rubor, Erythema. Periwound temperature was noted as No Abnormality. The periwound has tenderness on palpation. Assessment Active Problems ICD-10 I83.893 - Varicose veins of bilateral lower extremities with other complications L97.212 - Non-pressure chronic ulcer of right calf with fat layer exposed L97.222 - Non-pressure  chronic ulcer of left calf with fat layer exposed Plan Wound Cleansing: Wound #1 Right,Lateral Lower Leg: Clean wound with Normal Saline. Cleanse wound with mild soap and water Wound #2 Right,Posterior Lower Leg: Clean wound with Normal Saline. Cleanse wound with mild soap and water Wound #3 Left,Anterior Lower Leg: Clean wound with Normal Saline. Cleanse wound with mild soap and water Anesthetic (add to Medication List): Wound #1 Right,Lateral Lower Leg: Topical Lidocaine 4% cream applied to wound bed prior to debridement (In Clinic Only). Wound #2 Right,Posterior Lower Leg: Topical Lidocaine 4% cream applied to wound bed prior to debridement (In Clinic Only). Wound #3 Left,Anterior Lower Leg: Topical Lidocaine 4% cream applied to wound bed prior to debridement (In Clinic Only). Primary Wound Dressing: Wound #1 Right,Lateral Lower Leg: Iodoflex Wound #2 Right,Posterior Lower Leg: Iodoflex Wound #3 Left,Anterior Lower Leg: Iodoflex - Please be careful with this wound and do not scrub this wound!!! Secondary Dressing: Wound #1 Right,Lateral Lower Leg: ABD pad Wound #  2 Right,Posterior Lower Leg: ABD pad Wound #3 Left,Anterior Lower Leg: ABD pad Dressing Change Frequency: KYMBERLIE, BRAZEAU ([id number]) Wound #1 Right,Lateral Lower Leg: Three times weekly - HHRN to change Saturday, Tuesday and pt will come to [facility] on Thursday. Wound #2 Right,Posterior Lower Leg: Three times weekly - HHRN to change Saturday, Tuesday and pt will come to [facility]ound Care Clinic on Thursday. Wound #3 Left,Anterior Lower Leg: Three times weekly - HHRN to change Saturday, Tuesday and pt will come to Wound Care Clinic on Thursday. Follow-up Appointments: Wound #1 Right,Lateral Lower Leg: Return Appointment in 1 week. Wound #2 Right,Posterior Lower Leg: Return Appointment in 1 week. Wound #3 Left,Anterior Lower Leg: Return Appointment in 1 week. Edema Control: Wound #1 Right,Lateral Lower  Leg: 3 Layer Compression System - Bilateral - unna to anchor Elevate legs to the level of the heart and pump ankles as often as possible Wound #2 Right,Posterior Lower Leg: 3 Layer Compression System - Bilateral - unna to anchor Elevate legs to the level of the heart and pump ankles as often as possible Wound #3 Left,Anterior Lower Leg: 3 Layer Compression System - Bilateral - unna to anchor Elevate legs to the level of the heart and pump ankles as often as possible Additional Orders / Instructions: Wound #1 Right,Lateral Lower Leg: Increase protein intake. Wound #2 Right,Posterior Lower Leg: Increase protein intake. Wound #3 Left,Anterior Lower Leg: Increase protein intake. Home Health: Wound #1 Right,Lateral Lower Leg: Continue Home Health Visits Home Health Nurse may visit PRN to address patient s wound care needs. FACE TO FACE ENCOUNTER: MEDICARE and MEDICAID PATIENTS: I certify that this patient is under my care and that I had a face-to-face encounter that meets the physician face-to-face encounter requirements with this patient on this date. The encounter with the patient was in whole or in part for the following MEDICAL CONDITION: (primary reason for Home Healthcare) MEDICAL NECESSITY: I certify, that based on my findings, NURSING services are a medically necessary home health service. HOME BOUND STATUS: I certify that my clinical findings support that this patient is homebound (i.e., Due to illness or injury, pt requires aid of supportive devices such as crutches, cane, wheelchairs, walkers, the use of special transportation or the assistance of another person to leave their place of residence. There is a normal inability to leave the home and doing so requires considerable and taxing effort. Other absences are for medical reasons / religious services and are infrequent or of short duration when for other reasons). If current dressing causes regression in wound condition, may D/C  ordered dressing product/s and apply Normal Saline Moist Dressing daily until next Wound Healing Center / Other MD appointment. Notify Wound Healing Center of regression in wound condition at [phone]. Please direct any NON-WOUND related issues/requests for orders to patient's Primary Care Physician Wound #2 Right,Posterior Lower Leg: Continue Home Health Visits Home Health Nurse may visit PRN to address patient s wound care needs. FACE TO FACE ENCOUNTER: MEDICARE and MEDICAID PATIENTS: I certify that this patient is under my care and that I had a face-to-face encounter that meets the physician face-to-face encounter requirements with this patient on this date. The encounter with the patient was in whole or in part for the following MEDICAL CONDITION: (primary reason for Home Healthcare) MEDICAL NECESSITY: I certify, that based on my findings, NURSING services are a medically necessary home health service. HOME BOUND STATUS: I certify that my clinical findings support that this patient is homebound (i.e., Due  to illness or injury, pt requires aid of supportive devices such as crutches, cane, wheelchairs, walkers, the use of special transportation or the assistance of another person to leave their place of residence. There is a normal inability to leave the home and doing so requires considerable and taxing effort. Other absences are for medical reasons / religious services and are infrequent or of short duration when for other reasons). If current dressing causes regression in wound condition, may D/C ordered dressing product/s and apply Normal Saline Drummonds, Ezelle ([id number]) Moist Dressing daily until next Wound Healing Center / Other MD appointment. Notify Wound Healing Center of regression in wound condition at [phone]. Please direct any NON-WOUND related issues/requests for orders to patient's Primary Care Physician Wound #3 Left,Anterior Lower Leg: Continue Home Health  Visits Home Health Nurse may visit PRN to address patient s wound care needs. FACE TO FACE ENCOUNTER: MEDICARE and MEDICAID PATIENTS: I certify that this patient is under my care and that I had a face-to-face encounter that meets the physician face-to-face encounter requirements with this patient on this date. The encounter with the patient was in whole or in part for the following MEDICAL CONDITION: (primary reason for Home Healthcare) MEDICAL NECESSITY: I certify, that based on my findings, NURSING services are a medically necessary home health service. HOME BOUND STATUS: I certify that my clinical findings support that this patient is homebound (i.e., Due to illness or injury, pt requires aid of supportive devices such as crutches, cane, wheelchairs, walkers, the use of special transportation or the assistance of another person to leave their place of residence. There is a normal inability to leave the home and doing so requires considerable and taxing effort. Other absences are for medical reasons / religious services and are infrequent or of short duration when for other reasons). If current dressing causes regression in wound condition, may D/C ordered dressing product/s and apply Normal Saline Moist Dressing daily until next Wound Healing Center / Other MD appointment. Notify Wound Healing Center of regression in wound condition at [phone]. Please direct any NON-WOUND related issues/requests for orders to patient's Primary Care Physician The following medication(s) was prescribed: lidocaine topical 4 % cream 1 1 cream topical was prescribed at facility At this point I did make a note that we need to ensure that there is no debridement performed especially on the left lateral or extremity ulcer as this apparently has a very superficial blood vessel that is prone to bleeding fairly significantly apparently I find out afterwards that she was sent to the hospital previously because this was  an area the bladder would not stop. Originally I did cease today and everything appeared to be doing fine at the time of rapid we will switch to the Iodoflex to help with cleaning up the wounds again I'm not recommending any debridement at this point with mechanically or surgically. Please see above for specific wound care orders. We will see patient for re-evaluation in 1 week(s) here in the clinic. If anything worsens or changes patient will contact our office for additional recommendations. Electronic Signature(s) Signed: 05/21/2017 2:32:11 PM By: Lenda Kelp PA-C Entered By: Lenda Kelp on 05/20/2017 15:42:16 Lydia Buchanan ([id number]) -------------------------------------------------------------------------------- ROS/PFSH Details Patient Name: Lydia Buchanan Date of Service: 05/20/2017 1:45 PM Medical Record Number: [id number] Patient Account Number: [id number] Date of Birth/Sex: 15-Feb-1932 (84 y.o. F) Treating RN: Phillis Haggis Primary Care Provider: PATIENT, NO Other Clinician: Referring Provider: Franchot Mimes Treating Provider/Extender: STONE III, HOYT Weeks in Treatment:  1 Information Obtained From Patient Wound History Do you currently have one or more open woundso Yes How many open wounds do you currently haveo 2 Approximately how long have you had your woundso 6 weeks How have you been treating your wound(s) until nowo unknown Has your wound(s) ever healed and then re-openedo No Have you had any lab work done in the past montho No Have you tested positive for an antibiotic resistant organism (MRSA, VRE)o No Have you tested positive for osteomyelitis (bone infection)o No Have you had any tests for circulation on your legso No Constitutional Symptoms (General Health) Complaints and Symptoms: Negative for: Fever; Chills Cardiovascular Complaints and Symptoms: Positive for: LE edema Medical History: Positive for: Arrhythmia - a fib; Congestive Heart Failure;  Hypertension Negative for: Angina; Coronary Artery Disease; Deep Vein Thrombosis; Hypotension; Myocardial Infarction; Peripheral Arterial Disease; Peripheral Venous Disease; Phlebitis; Vasculitis Eyes Medical History: Positive for: Cataracts - removed Negative for: Glaucoma; Optic Neuritis Ear/Nose/Mouth/Throat Medical History: Negative for: Chronic sinus problems/congestion; Middle ear problems Hematologic/Lymphatic Medical History: Negative for: Anemia; Hemophilia; Human Immunodeficiency Virus; Lymphedema; Sickle Cell Disease Respiratory Complaints and Symptoms: No Complaints or Symptoms Medical HistoryCATRIONA, DILLENBECK ([id number]) Negative for: Aspiration; Asthma; Chronic Obstructive Pulmonary Disease (COPD); Pneumothorax; Sleep Apnea; Tuberculosis Gastrointestinal Complaints and Symptoms: No Complaints or Symptoms Medical History: Negative for: Cirrhosis ; Colitis; Crohnos; Hepatitis A; Hepatitis B; Hepatitis C Endocrine Medical History: Negative for: Type I Diabetes; Type II Diabetes Genitourinary Medical History: Negative for: End Stage Renal Disease Immunological Medical History: Negative for: Lupus Erythematosus; Raynaudos; Scleroderma Integumentary (Skin) Medical History: Negative for: History of Burn; History of pressure wounds Musculoskeletal Medical History: Negative for: Gout; Rheumatoid Arthritis; Osteoarthritis; Osteomyelitis Neurologic Medical History: Negative for: Dementia; Neuropathy; Quadriplegia; Paraplegia; Seizure Disorder Past Medical History Notes: TIA Oncologic Medical History: Negative for: Received Chemotherapy; Received Radiation Psychiatric Complaints and Symptoms: No Complaints or Symptoms Medical History: Negative for: Anorexia/bulimia; Confinement Anxiety HBO Extended History Items Eyes: Cataracts Schwieger, Mahi ([id number]) Immunizations Pneumococcal Vaccine: Received Pneumococcal Vaccination: Yes Implantable  Devices Family and Social History Cancer: Yes - Child; Diabetes: No; Heart Disease: Yes - Mother,Father; Hereditary Spherocytosis: No; Hypertension: Yes - Mother,Father; Kidney Disease: No; Lung Disease: No; Seizures: No; Stroke: No; Thyroid Problems: No; Tuberculosis: No; Former smoker - 10 years; Marital Status - Widowed; Alcohol Use: Never; Drug Use: No History; Caffeine Use: Daily; Financial Concerns: No; Food, Clothing or Shelter Needs: No; Support System Lacking: No; Transportation Concerns: No; Advanced Directives: No; Patient does not want information on Advanced Directives Physician Affirmation I have reviewed and agree with the above information. Electronic Signature(s) Signed: 05/21/2017 2:32:11 PM By: Lenda Kelp PA-C Signed: 05/21/2017 4:00:06 PM By: Alejandro Mulling Entered By: Lenda Kelp on 05/20/2017 15:39:11 Lydia Buchanan ([id number]) -------------------------------------------------------------------------------- SuperBill Details Patient Name: Lydia Buchanan Date of Service: 05/20/2017 Medical Record Number: [id number] Patient Account Number: [id number] Date of Birth/Sex: March 16, 1932 (84 y.o. 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

## 2017-05-24 NOTE — Progress Notes (Signed)
Lydia Buchanan, Lydia Buchanan ([id number]) Visit Report for 05/20/2017 Arrival Information Details Patient Name: Lydia Buchanan, Lydia Buchanan Date of Service: 05/20/2017 1:45 PM Medical Record Number: [id number] Patient Account Number: [id number] Date of Birth/Sex: May 09, 1932 (84 y.o. F) Treating RN: Curtis Sites Primary Care Tempest Frankland: PATIENT, NO Other Clinician: Referring Waver Dibiasio: Franchot Mimes Treating Deaira Leckey/Extender: STONE III, HOYT Weeks in Treatment: 1 Visit Information History Since Last Visit Added or deleted any medications: No Patient Arrived: Walker Any new allergies or adverse reactions: No Arrival Time: 13:57 Had a fall or experienced change in No Accompanied By: cg activities of daily living that may affect Transfer Assistance: None risk of falls: Patient Identification Verified: Yes Signs or symptoms of abuse/neglect since last visito No Secondary Verification Process Completed: Yes Hospitalized since last visit: No Implantable device outside of the clinic excluding No cellular tissue based products placed in the center since last visit: Has Dressing in Place as Prescribed: Yes Has Compression in Place as Prescribed: Yes Pain Present Now: No Electronic Signature(s) Signed: 05/20/2017 4:34:09 PM By: Curtis Sites Entered By: Curtis Sites on 05/20/2017 13:58:13 Lydia Buchanan ([id number]) -------------------------------------------------------------------------------- Encounter Discharge Information Details Patient Name: Lydia Buchanan Date of Service: 05/20/2017 1:45 PM Medical Record Number: [id number] Patient Account Number: [id number] Date of Birth/Sex: 07/29/1932 (84 y.o. F) Treating RN: Renne Crigler Primary Care Zitlali Primm: PATIENT, NO Other Clinician: Referring Kapri Nero: Franchot Mimes Treating Sebron Mcmahill/Extender: Linwood Dibbles, HOYT Weeks in Treatment: 1 Encounter Discharge Information Items Discharge Condition: Stable Ambulatory Status: Walker Discharge Destination:  Skilled Nursing Facility Orders Sent: Yes Transportation: Private Auto Accompanied By: caregiver Schedule Follow-up Appointment: Yes Clinical Summary of Care: Electronic Signature(s) Signed: 05/20/2017 5:11:28 PM By: Renne Crigler Entered By: Renne Crigler on 05/20/2017 14:59:13 Lydia Buchanan ([id number]) -------------------------------------------------------------------------------- Lower Extremity Assessment Details Patient Name: Lydia Buchanan Date of Service: 05/20/2017 1:45 PM Medical Record Number: [id number] Patient Account Number: [id number] Date of Birth/Sex: [birth year] (84 y.o. F) Treating RN: Curtis Sites Primary Care Olie Dibert: PATIENT, NO Other Clinician: Referring Hafsa Lohn: Franchot Mimes Treating Aurora Rody/Extender: STONE III, HOYT Weeks in Treatment: 1 Edema Assessment Assessed: [Left: No] [Right: No] Edema: [Left: Yes] [Right: Yes] Calf Left: Right: Point of Measurement: 32 cm From Medial Instep 31.8 cm 31.8 cm Ankle Left: Right: Point of Measurement: 10 cm From Medial Instep 20.7 cm 20 cm Vascular Assessment Claudication: Claudication Assessment [Left:None] [Right:None] Pulses: Dorsalis Pedis Palpable: [Left:Yes] [Right:Yes] Posterior Tibial Palpable: [Left:Yes] [Right:Yes] Extremity colors, hair growth, and conditions: Extremity Color: [Left:Normal] [Right:Normal] Hair Growth on Extremity: [Left:Yes] [Right:Yes] Temperature of Extremity: [Left:Warm] [Right:Warm] Capillary Refill: [Left:< 3 seconds] [Right:< 3 seconds] Dependent Rubor: [Left:No] [Right:No] Blanched when Elevated: [Left:No] [Right:No] Lipodermatosclerosis: [Left:No] [Right:No] Toe Nail Assessment Left: Right: Thick: Yes Yes Discolored: No No Deformed: No No Improper Length and Hygiene: Yes Yes Electronic Signature(s) Signed: 05/20/2017 4:34:09 PM By: Curtis Sites Entered By: Curtis Sites on 05/20/2017 14:06:14 Lydia Buchanan  ([id number]) -------------------------------------------------------------------------------- Multi Wound Chart Details Patient Name: Lydia Buchanan Date of Service: 05/20/2017 1:45 PM Medical Record Number: [id number] Patient Account Number: [id number] Date of Birth/Sex: Jun 23, 1932 (84 y.o. F) Treating RN: Phillis Haggis Primary Care Avaiyah Strubel: PATIENT, NO Other Clinician: Referring Waymond Meador: Franchot Mimes Treating Braxton Weisbecker/Extender: STONE III, HOYT Weeks in Treatment: 1 Vital Signs Height(in): 62 Pulse(bpm): 78 Weight(lbs): 112 Blood Pressure(mmHg): 119/67 Body Mass Index(BMI): 20 Temperature(F): 97.9 Respiratory Rate 16 (breaths/min): Photos: [1:No Photos] [2:No Photos] [3:No Photos] Wound Location: [1:Right Lower Leg - Lateral] [2:Right Lower Leg - Posterior] [3:Left Lower Leg - Anterior] Wounding Event: [1:Gradually Appeared] [2:Gradually Appeared] [3:Gradually Appeared] Primary Etiology: [1:Venous Leg Ulcer] [  2:Venous Leg Ulcer] [3:Venous Leg Ulcer] Comorbid History: [1:Cataracts, Arrhythmia, Congestive Heart Failure, Hypertension] [2:Cataracts, Arrhythmia, Congestive Heart Failure, Hypertension] [3:Cataracts, Arrhythmia, Congestive Heart Failure, Hypertension] Date Acquired: [1:03/29/2017] [2:03/29/2017] [3:03/29/2017] Weeks of Treatment: [1:1] [2:1] [3:1] Wound Status: [1:Open] [2:Open] [3:Open] Measurements L x W x D [1:0.7x0.6x0.1] [2:1.7x1.5x0.2] [3:1.2x1.6x0.1] (cm) Area (cm) : [1:0.33] [2:2.003] [3:1.508] Volume (cm) : [1:0.033] [2:0.401] [3:0.151] % Reduction in Area: [1:12.50%] [2:44.60%] [3:11.10%] % Reduction in Volume: [1:13.20%] [2:-11.10%] [3:11.20%] Classification: [1:Full Thickness Without Exposed Support Structures] [2:Full Thickness Without Exposed Support Structures] [3:Full Thickness Without Exposed Support Structures] Exudate Amount: [1:Small] [2:Large] [3:Large] Exudate Type: [1:Serous] [2:Serous] [3:Serous] Exudate Color: [1:amber] [2:amber]  [3:amber] Wound Margin: [1:Flat and Intact] [2:Flat and Intact] [3:Flat and Intact] Granulation Amount: [1:None Present (0%)] [2:Small (1-33%)] [3:Small (1-33%)] Granulation Quality: [1:N/A] [2:Pink] [3:Red] Necrotic Amount: [1:Large (67-100%)] [2:Large (67-100%)] [3:Large (67-100%)] Exposed Structures: [1:Fat Layer (Subcutaneous Tissue) Exposed: Yes Fascia: No Tendon: No Muscle: No Joint: No Bone: No] [2:Fat Layer (Subcutaneous Tissue) Exposed: Yes Fascia: No Tendon: No Muscle: No Joint: No Bone: No] [3:Fat Layer (Subcutaneous Tissue) Exposed: Yes  Fascia: No Tendon: No Muscle: No Joint: No Bone: No] Epithelialization: [1:None] [2:None] [3:None] Periwound Skin Texture: [1:Excoriation: No Induration: No Callus: No Crepitus: No] [2:Excoriation: No Induration: No Callus: No Crepitus: No] [3:Excoriation: No Induration: No Callus: No Crepitus: No] Rash: No Rash: No Rash: No Scarring: No Scarring: No Scarring: No Periwound Skin Moisture: Maceration: No Maceration: No Maceration: No Dry/Scaly: No Dry/Scaly: No Dry/Scaly: No Periwound Skin Color: Hemosiderin Staining: Yes Hemosiderin Staining: Yes Hemosiderin Staining: Yes Atrophie Blanche: No Atrophie Blanche: No Atrophie Blanche: No Cyanosis: No Cyanosis: No Cyanosis: No Ecchymosis: No Ecchymosis: No Ecchymosis: No Erythema: No Erythema: No Erythema: No Mottled: No Mottled: No Mottled: No Pallor: No Pallor: No Pallor: No Rubor: No Rubor: No Rubor: No Temperature: No Abnormality No Abnormality No Abnormality Tenderness on Palpation: Yes Yes Yes Wound Preparation: Ulcer Cleansing: Wound Ulcer Cleansing: Wound Ulcer Cleansing: Wound Cleanser Cleanser Cleanser Topical Anesthetic Applied: Topical Anesthetic Applied: Topical Anesthetic Applied: Other: lidocaine 4% Other: lidocaine 4% Other: lidocaine 4% Treatment Notes Electronic Signature(s) Signed: 05/21/2017 4:00:06 PM By: Alejandro Mulling Entered By: Alejandro Mulling on 05/20/2017 14:20:44 Lydia Buchanan ([id number]) -------------------------------------------------------------------------------- Multi-Disciplinary Care Plan Details Patient Name: Lydia Buchanan Date of Service: 05/20/2017 1:45 PM Medical Record Number: [id number] Patient Account Number: [id number] Date of Birth/Sex: 06-Mar-1932 (84 y.o. F) Treating RN: Phillis Haggis Primary Care Tanor Glaspy: PATIENT, NO Other Clinician: Referring Maybree Riling: Franchot Mimes Treating Cyara Devoto/Extender: STONE III, HOYT Weeks in Treatment: 1 Active Inactive ` Abuse / Safety / Falls / Self Care Management Nursing Diagnoses: Potential for falls Goals: Patient will not experience any injury related to falls Date Initiated: 05/13/2017 Target Resolution Date: 08/21/2017 Goal Status: Active Interventions: Assess Activities of Daily Living upon admission and as needed Assess fall risk on admission and as needed Assess: immobility, friction, shearing, incontinence upon admission and as needed Assess impairment of mobility on admission and as needed per policy Assess personal safety and home safety (as indicated) on admission and as needed Assess self care needs on admission and as needed Notes: ` Nutrition Nursing Diagnoses: Imbalanced nutrition Potential for alteratiion in Nutrition/Potential for imbalanced nutrition Goals: Patient/caregiver agrees to and verbalizes understanding of need to use nutritional supplements and/or vitamins as prescribed Date Initiated: 05/13/2017 Target Resolution Date: 09/18/2017 Goal Status: Active Interventions: Assess patient nutrition upon admission and as needed per policy Notes: ` Orientation to the Wound Care Program Nursing Diagnoses: Knowledge deficit related to the  wound healing center program Lydia Buchanan, Lydia Buchanan ([id number]) Goals: Patient/caregiver will verbalize understanding of the Wound Healing Center Program Date Initiated: 05/13/2017 Target Resolution  Date: 06/19/2017 Goal Status: Active Interventions: Provide education on orientation to the wound center Notes: ` Pain, Acute or Chronic Nursing Diagnoses: Pain, acute or chronic: actual or potential Potential alteration in comfort, pain Goals: Patient/caregiver will verbalize adequate pain control between visits Date Initiated: 05/13/2017 Target Resolution Date: 09/18/2017 Goal Status: Active Interventions: Complete pain assessment as per visit requirements Notes: ` Wound/Skin Impairment Nursing Diagnoses: Impaired tissue integrity Knowledge deficit related to ulceration/compromised skin integrity Goals: Ulcer/skin breakdown will have a volume reduction of 80% by week 12 Date Initiated: 05/13/2017 Target Resolution Date: 09/11/2017 Goal Status: Active Interventions: Assess patient/caregiver ability to perform ulcer/skin care regimen upon admission and as needed Assess ulceration(s) every visit Notes: Electronic Signature(s) Signed: 05/21/2017 4:00:06 PM By: Alejandro Mulling Entered By: Alejandro Mulling on 05/20/2017 14:20:14 Lydia Buchanan ([id number]) -------------------------------------------------------------------------------- Pain Assessment Details Patient Name: Lydia Buchanan Date of Service: 05/20/2017 1:45 PM Medical Record Number: [id number] Patient Account Number: [id number] Date of Birth/Sex: Apr 15, 1932 (84 y.o. F) Treating RN: Curtis Sites Primary Care Liley Rake: PATIENT, NO Other Clinician: Referring Tayloranne Lekas: Franchot Mimes Treating Zakir Henner/Extender: STONE III, HOYT Weeks in Treatment: 1 Active Problems Location of Pain Severity and Description of Pain Patient Has Paino No Site Locations Pain Management and Medication Current Pain Management: Electronic Signature(s) Signed: 05/20/2017 4:34:09 PM By: Curtis Sites Entered By: Curtis Sites on 05/20/2017 13:58:40 Lydia Buchanan  ([id number]) -------------------------------------------------------------------------------- Patient/Caregiver Education Details Patient Name: Lydia Buchanan Date of Service: 05/20/2017 1:45 PM Medical Record Number: [id number] Patient Account Number: [id number] Date of Birth/Gender: [birth year] (84 y.o. F) Treating RN: Renne Crigler Primary Care Physician: PATIENT, NO Other Clinician: Referring Physician: Franchot Mimes Treating Physician/Extender: Linwood Dibbles, HOYT Weeks in Treatment: 1 Education Assessment Education Provided To: Patient Education Topics Provided Wound Debridement: Handouts: Wound Debridement Methods: Explain/Verbal Responses: State content correctly Wound/Skin Impairment: Handouts: Caring for Your Ulcer Methods: Explain/Verbal Responses: State content correctly Electronic Signature(s) Signed: 05/20/2017 5:11:28 PM By: Renne Crigler Entered By: Renne Crigler on 05/20/2017 14:59:31 Lydia Buchanan ([id number]) -------------------------------------------------------------------------------- Wound Assessment Details Patient Name: Lydia Buchanan Date of Service: 05/20/2017 1:45 PM Medical Record Number: [id number] Patient Account Number: [id number] Date of Birth/Sex: 02-01-1932 (84 y.o. F) Treating RN: Curtis Sites Primary Care Aerilyn Slee: PATIENT, NO Other Clinician: Referring Kalei Meda: Franchot Mimes Treating Kieth Hartis/Extender: STONE III, HOYT Weeks in Treatment: 1 Wound Status Wound Number: 1 Primary Venous Leg Ulcer Etiology: Wound Location: Right Lower Leg - Lateral Wound Status: Open Wounding Event: Gradually Appeared Comorbid Cataracts, Arrhythmia, Congestive Heart Date Acquired: 03/29/2017 History: Failure, Hypertension Weeks Of Treatment: 1 Clustered Wound: No Photos Photo Uploaded By: Elliot Gurney, BSN, RN, CWS, Kim on 05/20/2017 16:44:18 Wound Measurements Length: (cm) 0.7 Width: (cm) 0.6 Depth: (cm) 0.1 Area: (cm) 0.33 Volume: (cm)  0.033 % Reduction in Area: 12.5% % Reduction in Volume: 13.2% Epithelialization: None Tunneling: No Undermining: No Wound Description Full Thickness Without Exposed Support Foul Odor Classification: Structures Slough/Fi Wound Margin: Flat and Intact Exudate Small Amount: Exudate Type: Serous Exudate Color: amber After Cleansing: No brino Yes Wound Bed Granulation Amount: None Present (0%) Exposed Structure Necrotic Amount: Large (67-100%) Fascia Exposed: No Necrotic Quality: Adherent Slough Fat Layer (Subcutaneous Tissue) Exposed: Yes Tendon Exposed: No Muscle Exposed: No Joint Exposed: No Bone Exposed: No Lydia Buchanan, Lydia Buchanan ([id number]) Periwound Skin Texture Texture Color No Abnormalities Noted: No No Abnormalities Noted: No Callus: No Atrophie Blanche: No Crepitus: No Cyanosis: No Excoriation: No  Ecchymosis: No Induration: No Erythema: No Rash: No Hemosiderin Staining: Yes Scarring: No Mottled: No Pallor: No Moisture Rubor: No No Abnormalities Noted: No Dry / Scaly: No Temperature / Pain Maceration: No Temperature: No Abnormality Tenderness on Palpation: Yes Wound Preparation Ulcer Cleansing: Wound Cleanser Topical Anesthetic Applied: Other: lidocaine 4%, Treatment Notes Wound #1 (Right, Lateral Lower Leg) 1. Cleansed with: Clean wound with Normal Saline 2. Anesthetic Topical Lidocaine 4% cream to wound bed prior to debridement 4. Dressing Applied: Iodoflex 5. Secondary Dressing Applied ABD Pad 7. Secured with 3 Layer Compression System - Bilateral Electronic Signature(s) Signed: 05/20/2017 4:34:09 PM By: Curtis Sites Entered By: Curtis Sites on 05/20/2017 14:09:34 Lydia Buchanan ([id number]) -------------------------------------------------------------------------------- Wound Assessment Details Patient Name: Lydia Buchanan Date of Service: 05/20/2017 1:45 PM Medical Record Number: [id number] Patient Account Number: [id number] Date of  Birth/Sex: [birth year] (84 y.o. F) Treating RN: Curtis Sites Primary Care Luellen Howson: PATIENT, NO Other Clinician: Referring Lexandra Rettke: Franchot Mimes Treating Carmelina Balducci/Extender: STONE III, HOYT Weeks in Treatment: 1 Wound Status Wound Number: 2 Primary Venous Leg Ulcer Etiology: Wound Location: Right Lower Leg - Posterior Wound Status: Open Wounding Event: Gradually Appeared Comorbid Cataracts, Arrhythmia, Congestive Heart Date Acquired: 03/29/2017 History: Failure, Hypertension Weeks Of Treatment: 1 Clustered Wound: No Photos Photo Uploaded By: Elliot Gurney, BSN, RN, CWS, Kim on 05/20/2017 16:44:46 Wound Measurements Length: (cm) 1.7 Width: (cm) 1.5 Depth: (cm) 0.2 Area: (cm) 2.003 Volume: (cm) 0.401 % Reduction in Area: 44.6% % Reduction in Volume: -11.1% Epithelialization: None Tunneling: No Undermining: No Wound Description Full Thickness Without Exposed Support Foul Odor Classification: Structures Slough/Fi Wound Margin: Flat and Intact Exudate Large Amount: Exudate Type: Serous Exudate Color: amber After Cleansing: No brino Yes Wound Bed Granulation Amount: Small (1-33%) Exposed Structure Granulation Quality: Pink Fascia Exposed: No Necrotic Amount: Large (67-100%) Fat Layer (Subcutaneous Tissue) Exposed: Yes Necrotic Quality: Adherent Slough Tendon Exposed: No Muscle Exposed: No Joint Exposed: No Bone Exposed: No Lydia Buchanan, Lydia Buchanan ([id number]) Periwound Skin Texture Texture Color No Abnormalities Noted: No No Abnormalities Noted: No Callus: No Atrophie Blanche: No Crepitus: No Cyanosis: No Excoriation: No Ecchymosis: No Induration: No Erythema: No Rash: No Hemosiderin Staining: Yes Scarring: No Mottled: No Pallor: No Moisture Rubor: No No Abnormalities Noted: No Dry / Scaly: No Temperature / Pain Maceration: No Temperature: No Abnormality Tenderness on Palpation: Yes Wound Preparation Ulcer Cleansing: Wound Cleanser Topical Anesthetic  Applied: Other: lidocaine 4%, Treatment Notes Wound #2 (Right, Posterior Lower Leg) 1. Cleansed with: Clean wound with Normal Saline 2. Anesthetic Topical Lidocaine 4% cream to wound bed prior to debridement 4. Dressing Applied: Iodoflex 5. Secondary Dressing Applied ABD Pad 7. Secured with 3 Layer Compression System - Bilateral Electronic Signature(s) Signed: 05/20/2017 4:34:09 PM By: Curtis Sites Entered By: Curtis Sites on 05/20/2017 14:10:12 Lydia Buchanan ([id number]) -------------------------------------------------------------------------------- Wound Assessment Details Patient Name: Lydia Buchanan Date of Service: 05/20/2017 1:45 PM Medical Record Number: [id number] Patient Account Number: [id number] Date of Birth/Sex: 1932-10-07 (84 y.o. F) Treating RN: Curtis Sites Primary Care Gillie Crisci: PATIENT, NO Other Clinician: Referring Shavontae Gibeault: Franchot Mimes Treating Melonee Gerstel/Extender: STONE III, HOYT Weeks in Treatment: 1 Wound Status Wound Number: 3 Primary Venous Leg Ulcer Etiology: Wound Location: Left Lower Leg - Anterior Wound Status: Open Wounding Event: Gradually Appeared Comorbid Cataracts, Arrhythmia, Congestive Heart Date Acquired: 03/29/2017 History: Failure, Hypertension Weeks Of Treatment: 1 Clustered Wound: No Photos Photo Uploaded By: Elliot Gurney, BSN, RN, CWS, Kim on 05/20/2017 16:44:47 Wound Measurements Length: (cm) 1.2 Width: (cm) 1.6 Depth: (cm) 0.1 Area: (cm) 1.508 Volume: (cm) 0.151 %  Reduction in Area: 11.1% % Reduction in Volume: 11.2% Epithelialization: None Tunneling: No Undermining: No Wound Description Full Thickness Without Exposed Support Foul Odor Classification: Structures Slough/Fi Wound Margin: Flat and Intact Exudate Large Amount: Exudate Type: Serous Exudate Color: amber After Cleansing: No brino Yes Wound Bed Granulation Amount: Small (1-33%) Exposed Structure Granulation Quality: Red Fascia Exposed:  No Necrotic Amount: Large (67-100%) Fat Layer (Subcutaneous Tissue) Exposed: Yes Necrotic Quality: Adherent Slough Tendon Exposed: No Muscle Exposed: No Joint Exposed: No Bone Exposed: No Lydia Buchanan, Lydia Buchanan ([id number]) Periwound Skin Texture Texture Color No Abnormalities Noted: No No Abnormalities Noted: No Callus: No Atrophie Blanche: No Crepitus: No Cyanosis: No Excoriation: No Ecchymosis: No Induration: No Erythema: No Rash: No Hemosiderin Staining: Yes Scarring: No Mottled: No Pallor: No Moisture Rubor: No No Abnormalities Noted: No Dry / Scaly: No Temperature / Pain Maceration: No Temperature: No Abnormality Tenderness on Palpation: Yes Wound Preparation Ulcer Cleansing: Wound Cleanser Topical Anesthetic Applied: Other: lidocaine 4%, Treatment Notes Wound #3 (Left, Anterior Lower Leg) 1. Cleansed with: Clean wound with Normal Saline 2. Anesthetic Topical Lidocaine 4% cream to wound bed prior to debridement 4. Dressing Applied: Iodoflex 5. Secondary Dressing Applied ABD Pad 7. Secured with 3 Layer Compression System - Bilateral Electronic Signature(s) Signed: 05/20/2017 4:34:09 PM By: Curtis Sites Entered By: Curtis Sites on 05/20/2017 14:10:42 Lydia Buchanan ([id number]) -------------------------------------------------------------------------------- Vitals Details Patient Name: Lydia Buchanan Date of Service: 05/20/2017 1:45 PM Medical Record Number: [id number] Patient Account Number: [id number] Date of Birth/Sex: Dec 04, 1932 (84 y.o. F) Treating RN: Curtis Sites Primary Care Krista Som: PATIENT, NO Other Clinician: Referring Naseem Adler: Franchot Mimes Treating Jacki Couse/Extender: STONE III, HOYT Weeks in Treatment: 1 Vital Signs Time Taken: 13:59 Temperature (F): 97.9 Height (in): 62 Pulse (bpm): 78 Weight (lbs): 112 Respiratory Rate (breaths/min): 16 Body Mass Index (BMI): 20.5 Blood Pressure (mmHg): 119/67 Reference Range: 80 - 120 mg /  dl Electronic Signature(s) Signed: 05/20/2017 4:34:09 PM By: Curtis Sites Entered By: Curtis Sites on 05/20/2017 13:59:36

## 2017-05-27 ENCOUNTER — Encounter: Payer: Medicare Other | Admitting: Nurse Practitioner

## 2017-05-27 DIAGNOSIS — I83018 Varicose veins of right lower extremity with ulcer other part of lower leg: Secondary | ICD-10-CM | POA: Diagnosis not present

## 2017-05-30 NOTE — Progress Notes (Signed)
Lydia, Buchanan ([id number]) Visit Report for 05/27/2017 Chief Complaint Document Details Patient Name: Lydia Buchanan, Lydia Buchanan Date of Service: 05/27/2017 1:30 PM Medical Record Number: [id number] Patient Account Number: [id number] Date of Birth/Sex: 1932/09/05 (84 y.o. F) Treating RN: Phillis Haggis Primary Care Provider: PATIENT, NO Other Clinician: Referring Provider: Franchot Mimes Treating Provider/Extender: Kathreen Cosier in Treatment: 2 Information Obtained from: Patient Chief Complaint She is here for evaluation of bilateral lower extremity ulcers Electronic Signature(s) Signed: 05/27/2017 3:31:35 PM By: Bonnell Public Entered By: Bonnell Public on 05/27/2017 15:31:35 Lydia Buchanan ([id number]) -------------------------------------------------------------------------------- HPI Details Patient Name: Lydia Buchanan Date of Service: 05/27/2017 1:30 PM Medical Record Number: [id number] Patient Account Number: [id number] Date of Birth/Sex: [birth year] (84 y.o. F) Treating RN: Phillis Haggis Primary Care Provider: PATIENT, NO Other Clinician: Referring Provider: Franchot Mimes Treating Provider/Extender: Kathreen Cosier in Treatment: 2 History of Present Illness HPI Description: 05/13/17-She is here for initial evaluation of bilateral location big ulcers. She recently moved here from [place], resides at [facility]. She states that approximately 6-8 weeks ago she developed bilateral lower extremity edema with blisters which resulted in ulcerations. She has been going to [place] [facility] and was being treated with Xeroform and thigh-high TED hose compression. She was on Eliquis for atrial fibrillation, but that was DC'd approximately 3 weeks ago by cardiology when she was hospitalized after uncontrolled bleeding from the left lower extremity ulcer. She is extremely anxious, nervous and would prefer no debridement at today's appointment; repeating questions. We will  initiate home health. She is non-diabetic and a former smoker, quit  10 years ago. According to Epic records, she has been to the emergency department twice since arrival to [facility]; visit on 4/28 for BLE pain a DVT study was done and negative on 4/30 visit her daughter requested a psych evaluation r/t manipulative behavior and extreme anxiety. 05/20/17 on evaluation today patient's wounds actually show signs of being a little bit better compared to last week's evaluation that I was not the provider see her at that point. She does have some Slough noted on the surface of the wound beds but fortunately there does not appear to be any evidence of significant infection. No fevers, chills, nausea, or vomiting noted at this time. Patient does appear to be extremely anxious. 05/27/17-She is here in follow-up evaluation for bilateral lower extremity ulcers. She is extremely anxious. It is noted that last week there was bleeding complications with the left lower extremity ulcer, there is residual chemical cautery effect. We will defer debridement, overall there is improvement to these ulcers. We will continue with Iodosorb/Iodoflex and 3 layer compression wrap. Electronic Signature(s) Signed: 05/27/2017 3:32:41 PM By: Bonnell Public Entered By: Bonnell Public on 05/27/2017 15:32:41 Lydia Buchanan ([id number]) -------------------------------------------------------------------------------- Physician Orders Details Patient Name: Lydia Buchanan Date of Service: 05/27/2017 1:30 PM Medical Record Number: [id number] Patient Account Number: [id number] Date of Birth/Sex: 27-Oct-1932 (84 y.o. F) Treating RN: Phillis Haggis Primary Care Provider: PATIENT, NO Other Clinician: Referring Provider: Franchot Mimes Treating Provider/Extender: Kathreen Cosier in Treatment: 2 Verbal / Phone Orders: Yes Clinician: Pinkerton, Debi Read Back and Verified: Yes Diagnosis Coding Wound Cleansing Wound #1  Right,Lateral Lower Leg o Clean wound with Normal Saline. o Cleanse wound with mild soap and water Wound #2 Right,Posterior Lower Leg o Clean wound with Normal Saline. o Cleanse wound with mild soap and water Wound #3 Left,Anterior Lower Leg o Clean wound with Normal Saline. o Cleanse wound with mild soap and water Anesthetic (add to Medication List)  Wound #1 Right,Lateral Lower Leg o Topical Lidocaine 4% cream applied to wound bed prior to debridement (In Clinic Only). Wound #2 Right,Posterior Lower Leg o Topical Lidocaine 4% cream applied to wound bed prior to debridement (In Clinic Only). Wound #3 Left,Anterior Lower Leg o Topical Lidocaine 4% cream applied to wound bed prior to debridement (In Clinic Only). Primary Wound Dressing Wound #1 Right,Lateral Lower Leg o Iodoflex Wound #2 Right,Posterior Lower Leg o Iodoflex Wound #3 Left,Anterior Lower Leg o Iodoflex - Please be careful with this wound and do not scrub this wound!!! Secondary Dressing Wound #1 Right,Lateral Lower Leg o ABD pad Wound #2 Right,Posterior Lower Leg o ABD pad Wound #3 Left,Anterior Lower Leg o ABD pad Dressing Change Frequency Tonche, Dailah ([id number]) Wound #1 Right,Lateral Lower Leg o Dressing is to be changed Monday and Thursday. - HHRN to change dressings on Mondays and pt will be seen in Wound Care Clinic on Thursdays Wound #2 Right,Posterior Lower Leg o Dressing is to be changed Monday and Thursday. - HHRN to change dressings on Mondays and pt will be seen in Wound Care Clinic on Thursdays Wound #3 Left,Anterior Lower Leg o Dressing is to be changed Monday and Thursday. - HHRN to change dressings on Mondays and pt will be seen in Wound Care Clinic on Thursdays Follow-up Appointments Wound #1 Right,Lateral Lower Leg o Return Appointment in 1 week. Wound #2 Right,Posterior Lower Leg o Return Appointment in 1 week. Wound #3 Left,Anterior Lower  Leg o Return Appointment in 1 week. Edema Control Wound #1 Right,Lateral Lower Leg o 3 Layer Compression System - Bilateral o Elevate legs to the level of the heart and pump ankles as often as possible Wound #2 Right,Posterior Lower Leg o 3 Layer Compression System - Bilateral o Elevate legs to the level of the heart and pump ankles as often as possible Wound #3 Left,Anterior Lower Leg o 3 Layer Compression System - Bilateral o Elevate legs to the level of the heart and pump ankles as often as possible Additional Orders / Instructions Wound #1 Right,Lateral Lower Leg o Increase protein intake. Wound #2 Right,Posterior Lower Leg o Increase protein intake. Wound #3 Left,Anterior Lower Leg o Increase protein intake. Home Health Wound #1 Right,Lateral Lower Leg o Continue Home Health Visits - no unna boots use a 3 - layer o Home Health Nurse may visit PRN to address patientos wound care needs. o FACE TO FACE ENCOUNTER: MEDICARE and MEDICAID PATIENTS: I certify that this patient is under my care and that I had a face-to-face encounter that meets the physician face-to-face encounter requirements with this patient on this date. The encounter with the patient was in whole or in part for the following MEDICAL CONDITION: (primary reason for Home Healthcare) MEDICAL NECESSITY: I certify, that based on my findings, NURSING services are a medically necessary home health service. HOME BOUND STATUS: I certify that my clinical findings support that this patient is homebound (i.e., Due to illness or injury, pt requires aid of supportive devices such as crutches, cane, wheelchairs, walkers, the use of special transportation or the [facility], [place] ([id number]) assistance of another person to leave their place of residence. There is a normal inability to leave the home and doing so requires considerable and taxing effort. Other absences are for medical reasons /  religious services and are infrequent or of short duration when for other reasons). o If current dressing causes regression in wound condition, may D/C ordered dressing product/s and apply Normal Saline Moist Dressing  daily until next Wound Healing Center / Other MD appointment. Notify Wound Healing Center of regression in wound condition at [phone]. o Please direct any NON-WOUND related issues/requests for orders to patient's Primary Care Physician Wound #2 Right,Posterior Lower Leg o Continue Home Health Visits - no unna boots use a 3 - layer o Home Health Nurse may visit PRN to address patientos wound care needs. o FACE TO FACE ENCOUNTER: MEDICARE and MEDICAID PATIENTS: I certify that this patient is under my care and that I had a face-to-face encounter that meets the physician face-to-face encounter requirements with this patient on this date. The encounter with the patient was in whole or in part for the following MEDICAL CONDITION: (primary reason for Home Healthcare) MEDICAL NECESSITY: I certify, that based on my findings, NURSING services are a medically necessary home health service. HOME BOUND STATUS: I certify that my clinical findings support that this patient is homebound (i.e., Due to illness or injury, pt requires aid of supportive devices such as crutches, cane, wheelchairs, walkers, the use of special transportation or the assistance of another person to leave their place of residence. There is a normal inability to leave the home and doing so requires considerable and taxing effort. Other absences are for medical reasons / religious services and are infrequent or of short duration when for other reasons). o If current dressing causes regression in wound condition, may D/C ordered dressing product/s and apply Normal Saline Moist Dressing daily until next Wound Healing Center / Other MD appointment. Notify Wound Healing Center of regression in wound condition at  [phone]. o Please direct any NON-WOUND related issues/requests for orders to patient's Primary Care Physician Wound #3 Left,Anterior Lower Leg o Continue Home Health Visits - no unna boots use a 3 - layer o Home Health Nurse may visit PRN to address patientos wound care needs. o FACE TO FACE ENCOUNTER: MEDICARE and MEDICAID PATIENTS: I certify that this patient is under my care and that I had a face-to-face encounter that meets the physician face-to-face encounter requirements with this patient on this date. The encounter with the patient was in whole or in part for the following MEDICAL CONDITION: (primary reason for Home Healthcare) MEDICAL NECESSITY: I certify, that based on my findings, NURSING services are a medically necessary home health service. HOME BOUND STATUS: I certify that my clinical findings support that this patient is homebound (i.e., Due to illness or injury, pt requires aid of supportive devices such as crutches, cane, wheelchairs, walkers, the use of special transportation or the assistance of another person to leave their place of residence. There is a normal inability to leave the home and doing so requires considerable and taxing effort. Other absences are for medical reasons / religious services and are infrequent or of short duration when for other reasons). o If current dressing causes regression in wound condition, may D/C ordered dressing product/s and apply Normal Saline Moist Dressing daily until next Wound Healing Center / Other MD appointment. Notify Wound Healing Center of regression in wound condition at [phone]. o Please direct any NON-WOUND related issues/requests for orders to patient's Primary Care Physician Patient Medications Allergies: Macrobid, oxycodone Notifications Medication Indication Start End lidocaine DOSE 1 - topical 4 % cream - 1 cream topical Electronic Signature(s) Signed: 05/27/2017 5:24:28 PM By: Bonnell Public Signed: 05/28/2017 4:38:26 PM By: Orson Eva, Shanece ([id number]) Entered By: Alejandro Mulling on 05/27/2017 14:13:06 ADABELLE, GRIFFITHS ([id number]) -------------------------------------------------------------------------------- Prescription 05/27/2017 Patient Name: Lydia Buchanan Provider: Bonnell Public  NP Date of Birth: 1932/12/21 NPI#: [id number] Sex: F DEA#: [id number] Phone #: [phone] License #: Patient Address: [facility] [address] [facility] [address], [address] [place], [place] [address] [place], [place] [address] [phone] Allergies Macrobid oxycodone Medication Medication: Route: Strength: Form: lidocaine 4 % topical cream topical 4% cream Class: TOPICAL LOCAL ANESTHETICS Dose: Frequency / Time: Indication: 1 1 cream topical Number of Refills: Number of Units: 0 Generic Substitution: Start Date: End Date: One Time Use: Substitution Permitted No Note to Pharmacy: Signature(s): Date(s): Electronic Signature(s) Signed: 05/27/2017 5:24:28 PM By: Bonnell Public Signed: 05/28/2017 4:38:26 PM By: Alejandro Mulling Entered By: Alejandro Mulling on 05/27/2017 14:13:07 Lydia Buchanan ([id number]) [place], Claris Che ([id number]) --------------------------------------------------------------------------------  Problem List Details Patient Name: Lydia Buchanan Date of Service: 05/27/2017 1:30 PM Medical Record Number: [id number] Patient Account Number: [id number] Date of Birth/Sex: December 16, 1932 (84 y.o. 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 with other  05/13/2017 Yes complications L97.212 Non-pressure chronic ulcer of right calf with fat layer exposed 05/13/2017 Yes L97.222 Non-pressure chronic ulcer of left calf with fat layer exposed 05/13/2017 Yes Inactive Problems Resolved Problems Electronic Signature(s) Signed: 05/27/2017 3:31:07 PM By: Bonnell Public Entered By: Bonnell Public on 05/27/2017 15:31:07 Lydia Buchanan ([id number]) -------------------------------------------------------------------------------- Progress Note Details Patient Name: Lydia Buchanan Date of Service: 05/27/2017 1:30 PM Medical Record Number: [id number] Patient Account Number: [id number] Date of Birth/Sex: 09/30/1932 (84 y.o. F) Treating RN: Phillis Haggis Primary Care Provider: PATIENT, NO Other Clinician: Referring Provider: Franchot Mimes Treating Provider/Extender: Kathreen Cosier in Treatment: 2 Subjective Chief Complaint Information obtained from Patient She is here for evaluation of bilateral lower extremity ulcers History of Present Illness (HPI) 05/13/17-She is here for initial evaluation of bilateral location big ulcers. She recently moved here from [place], resides at [facility]. She states that approximately 6-8 weeks ago she developed bilateral lower extremity edema with blisters which resulted in ulcerations. She has been going to [place] [facility] clinic and was being treated with Xeroform and thigh-high TED hose compression. She was on Eliquis for atrial fibrillation, but that was DC'd approximately 3 weeks ago by cardiology when she was hospitalized after uncontrolled bleeding from the left lower extremity ulcer. She is extremely anxious, nervous and would prefer no debridement at today's appointment; repeating questions. We will initiate home health. She is non-diabetic and a former smoker, quit  10 years ago. According to Epic records, she has been to the emergency department twice since arrival to [facility]; visit on 4/28 for BLE  pain a DVT study was done and negative on 4/30 visit her daughter requested a psych evaluation r/t manipulative behavior and extreme anxiety. 05/20/17 on evaluation today patient's wounds actually show signs of being a little bit better compared to last week's evaluation that I was not the provider see her at that point. She does have some Slough noted on the surface of the wound beds but fortunately there does not appear to be any evidence of significant infection. No fevers, chills, nausea, or vomiting noted at this time. Patient does appear to be extremely anxious. 05/27/17-She is here in follow-up evaluation for bilateral lower extremity ulcers. She is extremely anxious. It is noted that last week there was bleeding complications with the left  lower extremity ulcer, there is residual chemical cautery effect. We will defer debridement, overall there is improvement to these ulcers. We will continue with Iodosorb/Iodoflex and 3 layer compression wrap. Patient History Information obtained from Patient. Family History Cancer - Child, Heart Disease - Mother,Father, Hypertension - Mother,Father, No family history of Diabetes, Hereditary Spherocytosis, Kidney Disease, Lung Disease, Seizures, Stroke, Thyroid Problems, Tuberculosis. Social History Former smoker - 10 years, Marital Status - Widowed, Alcohol Use - Never, Drug Use - No History, Caffeine Use - Daily. Medical And Surgical History Notes Neurologic TIA Thurow, Emilyann ([id number]) Objective Constitutional Vitals Time Taken: 1:47 PM, Height: 62 in, Weight: 112 lbs, BMI: 20.5, Temperature: 98.0 F, Pulse: 73 bpm, Respiratory Rate: 16 breaths/min, Blood Pressure: 132/70 mmHg. Integumentary (Hair, Skin) Wound #1 status is Open. Original cause of wound was Gradually Appeared. The wound is located on the Right,Lateral Lower Leg. The wound measures 0.7cm length x 0.6cm width x 0.2cm depth; 0.33cm^2 area and 0.066cm^3 volume. There is Fat  Layer (Subcutaneous Tissue) Exposed exposed. There is no tunneling or undermining noted. There is a small amount of serous drainage noted. The wound margin is flat and intact. There is medium (34-66%) pink granulation within the wound bed. There is a medium (34-66%) amount of necrotic tissue within the wound bed including Adherent Slough. The periwound skin appearance exhibited: Hemosiderin Staining. The periwound skin appearance did not exhibit: Callus, Crepitus, Excoriation, Induration, Rash, Scarring, Dry/Scaly, Maceration, Atrophie Blanche, Cyanosis, Ecchymosis, Mottled, Pallor, Rubor, Erythema. Periwound temperature was noted as No Abnormality. The periwound has tenderness on palpation. Wound #2 status is Open. Original cause of wound was Gradually Appeared. The wound is located on the Right,Posterior Lower Leg. The wound measures 1.3cm length x 1.3cm width x 0.2cm depth; 1.327cm^2 area and 0.265cm^3 volume. There is Fat Layer (Subcutaneous Tissue) Exposed exposed. There is no tunneling or undermining noted. There is a large amount of serous drainage noted. The wound margin is flat and intact. There is medium (34-66%) pink granulation within the wound bed. There is a medium (34-66%) amount of necrotic tissue within the wound bed including Adherent Slough. The periwound skin appearance exhibited: Hemosiderin Staining. The periwound skin appearance did not exhibit: Callus, Crepitus, Excoriation, Induration, Rash, Scarring, Dry/Scaly, Maceration, Atrophie Blanche, Cyanosis, Ecchymosis, Mottled, Pallor, Rubor, Erythema. Periwound temperature was noted as No Abnormality. The periwound has tenderness on palpation. Wound #3 status is Open. Original cause of wound was Gradually Appeared. The wound is located on the Left,Anterior Lower Leg. The wound measures 1cm length x 1.4cm width x 0.1cm depth; 1.1cm^2 area and 0.11cm^3 volume. There is Fat Layer (Subcutaneous Tissue) Exposed exposed. There is no  tunneling or undermining noted. There is a large amount of serous drainage noted. The wound margin is flat and intact. There is medium (34-66%) red granulation within the wound bed. There is a medium (34-66%) amount of necrotic tissue within the wound bed including Adherent Slough. The periwound skin appearance exhibited: Hemosiderin Staining. The periwound skin appearance did not exhibit: Callus, Crepitus, Excoriation, Induration, Rash, Scarring, Dry/Scaly, Maceration, Atrophie Blanche, Cyanosis, Ecchymosis, Mottled, Pallor, Rubor, Erythema. Periwound temperature was noted as No Abnormality. The periwound has tenderness on palpation. 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 Cleanse wound  with mild soap and water Wound #2 Right,Posterior Lower Leg: Clean wound with Normal Saline. Cleanse wound with mild soap and water Wound #3 Left,Anterior Lower Leg: Clean wound with Normal Saline. Cleanse wound with mild soap and water Anesthetic (add to Medication List): Wound #1 Right,Lateral Lower Leg: Topical Lidocaine 4% cream applied to wound bed prior to debridement (In Clinic Only). Wound #2 Right,Posterior Lower Leg: Topical Lidocaine 4% cream applied to wound bed prior to debridement (In Clinic Only). Wound #3 Left,Anterior Lower Leg: Topical Lidocaine 4% cream applied to wound bed prior to debridement (In Clinic Only). Primary Wound Dressing: Wound #1 Right,Lateral Lower Leg: Iodoflex Wound #2 Right,Posterior Lower Leg: Iodoflex Wound #3 Left,Anterior Lower Leg: Iodoflex - Please be careful with this wound and do not scrub this wound!!! Secondary  Dressing: Wound #1 Right,Lateral Lower Leg: ABD pad Wound #2 Right,Posterior Lower Leg: ABD pad Wound #3 Left,Anterior Lower Leg: ABD pad Dressing Change Frequency: Wound #1 Right,Lateral Lower Leg: Dressing is to be changed Monday and Thursday. - HHRN to change dressings on Mondays and pt will be seen in Wound Care Clinic on Thursdays Wound #2 Right,Posterior Lower Leg: Dressing is to be changed Monday and Thursday. - HHRN to change dressings on Mondays and pt will be seen in Wound Care Clinic on Thursdays Wound #3 Left,Anterior Lower Leg: Dressing is to be changed Monday and Thursday. - HHRN to change dressings on Mondays and pt will be seen in Wound Care Clinic on Thursdays Follow-up Appointments: Wound #1 Right,Lateral Lower Leg: Return Appointment in 1 week. Wound #2 Right,Posterior Lower Leg: Return Appointment in 1 week. Wound #3 Left,Anterior Lower Leg: Return Appointment in 1 week. Edema Control: Wound #1 Right,Lateral Lower Leg: 3 Layer Compression System - Bilateral Elevate legs to the level of the heart and pump ankles as often as possible Wound #2 Right,Posterior Lower Leg: 3 Layer Compression System - Bilateral Elevate legs to the level of the heart and pump ankles as often as possible Wound #3 Left,Anterior Lower Leg: 3 Layer Compression System - Bilateral Elevate legs to the level of the heart and pump ankles as often as possible Additional Orders / Instructions: Wound #1 Right,Lateral Lower Leg: Increase protein intake. [place], Juanna ([id number]) Wound #2 Right,Posterior Lower Leg: Increase protein intake. Wound #3 Left,Anterior Lower Leg: Increase protein intake. Home Health: Wound #1 Right,Lateral Lower Leg: Continue Home Health Visits - no unna boots use a 3 - layer Home Health Nurse may visit PRN to address patient s wound care needs. FACE TO FACE ENCOUNTER: MEDICARE and MEDICAID PATIENTS: I certify that this patient is under my care and that  I had a face-to-face encounter that meets the physician face-to-face encounter requirements with this patient on this date. The encounter with the patient was in whole or in part for the following MEDICAL CONDITION: (primary reason for Home Healthcare) MEDICAL NECESSITY: I certify, that based on my findings, NURSING services are a medically necessary home health service. HOME BOUND STATUS: I certify that my clinical findings support that this patient is homebound (i.e., Due to illness or injury, pt requires aid of supportive devices such as crutches, cane, wheelchairs, walkers, the use of special transportation or the assistance of another person to leave their place of residence. There is a normal inability to leave the home and doing so requires considerable and taxing effort. Other absences are for medical reasons / religious services and are infrequent or of short duration when for other reasons). If current dressing causes regression  in wound condition, may D/C ordered dressing product/s and apply Normal Saline Moist Dressing daily until next Wound Healing Center / Other MD appointment. Notify Wound Healing Center of regression in wound condition at [phone]. Please direct any NON-WOUND related issues/requests for orders to patient's Primary Care Physician Wound #2 Right,Posterior Lower Leg: Continue Home Health Visits - no unna boots use a 3 - layer Home Health Nurse may visit PRN to address patient s wound care needs. FACE TO FACE ENCOUNTER: MEDICARE and MEDICAID PATIENTS: I certify that this patient is under my care and that I had a face-to-face encounter that meets the physician face-to-face encounter requirements with this patient on this date. The encounter with the patient was in whole or in part for the following MEDICAL CONDITION: (primary reason for Home Healthcare) MEDICAL NECESSITY: I certify, that based on my findings, NURSING services are a medically necessary home health  service. HOME BOUND STATUS: I certify that my clinical findings support that this patient is homebound (i.e., Due to illness or injury, pt requires aid of supportive devices such as crutches, cane, wheelchairs, walkers, the use of special transportation or the assistance of another person to leave their place of residence. There is a normal inability to leave the home and doing so requires considerable and taxing effort. Other absences are for medical reasons / religious services and are infrequent or of short duration when for other reasons). If current dressing causes regression in wound condition, may D/C ordered dressing product/s and apply Normal Saline Moist Dressing daily until next Wound Healing Center / Other MD appointment. Notify Wound Healing Center of regression in wound condition at [phone]. Please direct any NON-WOUND related issues/requests for orders to patient's Primary Care Physician Wound #3 Left,Anterior Lower Leg: Continue Home Health Visits - no unna boots use a 3 - layer Home Health Nurse may visit PRN to address patient s wound care needs. FACE TO FACE ENCOUNTER: MEDICARE and MEDICAID PATIENTS: I certify that this patient is under my care and that I had a face-to-face encounter that meets the physician face-to-face encounter requirements with this patient on this date. The encounter with the patient was in whole or in part for the following MEDICAL CONDITION: (primary reason for Home Healthcare) MEDICAL NECESSITY: I certify, that based on my findings, NURSING services are a medically necessary home health service. HOME BOUND STATUS: I certify that my clinical findings support that this patient is homebound (i.e., Due to illness or injury, pt requires aid of supportive devices such as crutches, cane, wheelchairs, walkers, the use of special transportation or the assistance of another person to leave their place of residence. There is a normal inability to leave  the home and doing so requires considerable and taxing effort. Other absences are for medical reasons / religious services and are infrequent or of short duration when for other reasons). If current dressing causes regression in wound condition, may D/C ordered dressing product/s and apply Normal Saline Moist Dressing daily until next Wound Healing Center / Other MD appointment. Notify Wound Healing Center of regression in wound condition at [phone]. Please direct any NON-WOUND related issues/requests for orders to patient's Primary Care Physician The following medication(s) was prescribed: lidocaine topical 4 % cream 1 1 cream topical was prescribed at facility [facility], Claris Che ([id number]) Electronic Signature(s) Signed: 05/27/2017 3:33:03 PM By: Bonnell Public Entered By: Bonnell Public on 05/27/2017 15:33:03 Lydia Buchanan ([id number]) -------------------------------------------------------------------------------- ROS/PFSH Details Patient Name: Lydia Buchanan Date of Service: 05/27/2017 1:30 PM Medical Record Number:  [id number] Patient Account Number: [id number] Date of Birth/Sex: [birth year] (84 y.o. F) Treating RN: Phillis Haggis Primary Care Provider: PATIENT, NO Other Clinician: Referring Provider: Franchot Mimes Treating Provider/Extender: Kathreen Cosier in Treatment: 2 Information Obtained From Patient Wound History Do you currently have one or more open woundso Yes How many open wounds do you currently haveo 2 Approximately how long have you had your woundso 6 weeks How have you been treating your wound(s) until nowo unknown Has your wound(s) ever healed and then re-openedo No Have you had any lab work done in the past montho No Have you tested positive for an antibiotic resistant organism (MRSA, VRE)o No Have you tested positive for osteomyelitis (bone infection)o No Have you had any tests for circulation on your legso No Eyes Medical History: Positive for:  Cataracts - removed Negative for: Glaucoma; Optic Neuritis Ear/Nose/Mouth/Throat Medical History: Negative for: Chronic sinus problems/congestion; Middle ear problems Hematologic/Lymphatic Medical History: Negative for: Anemia; Hemophilia; Human Immunodeficiency Virus; Lymphedema; Sickle Cell Disease Respiratory Medical History: Negative for: Aspiration; Asthma; Chronic Obstructive Pulmonary Disease (COPD); Pneumothorax; Sleep Apnea; Tuberculosis Cardiovascular Medical History: Positive for: Arrhythmia - a fib; Congestive Heart Failure; Hypertension Negative for: Angina; Coronary Artery Disease; Deep Vein Thrombosis; Hypotension; Myocardial Infarction; Peripheral Arterial Disease; Peripheral Venous Disease; Phlebitis; Vasculitis Gastrointestinal Medical History: Negative for: Cirrhosis ; Colitis; Crohnos; Hepatitis A; Hepatitis B; Hepatitis C Endocrine POCAHONTAS, COHENOUR ([id number]) Medical History: Negative for: Type I Diabetes; Type II Diabetes Genitourinary Medical History: Negative for: End Stage Renal Disease Immunological Medical History: Negative for: Lupus Erythematosus; Raynaudos; Scleroderma Integumentary (Skin) Medical History: Negative for: History of Burn; History of pressure wounds Musculoskeletal Medical History: Negative for: Gout; Rheumatoid Arthritis; Osteoarthritis; Osteomyelitis Neurologic Medical History: Negative for: Dementia; Neuropathy; Quadriplegia; Paraplegia; Seizure Disorder Past Medical History Notes: TIA Oncologic Medical History: Negative for: Received Chemotherapy; Received Radiation Psychiatric Medical History: Negative for: Anorexia/bulimia; Confinement Anxiety HBO Extended History Items Eyes: Cataracts Immunizations Pneumococcal Vaccine: Received Pneumococcal Vaccination: Yes Implantable Devices Family and Social History Cancer: Yes - Child; Diabetes: No; Heart Disease: Yes - Mother,Father; Hereditary Spherocytosis: No;  Hypertension: Yes - Mother,Father; Kidney Disease: No; Lung Disease: No; Seizures: No; Stroke: No; Thyroid Problems: No; Tuberculosis: No; Former smoker - 10 years; Marital Status - Widowed; Alcohol Use: Never; Drug Use: No History; Caffeine Use: Daily; Financial Concerns: No; Food, Clothing or Shelter Needs: No; Support System Lacking: No; Transportation Concerns: No; Advanced Directives: No; Patient does not want information on Advanced Directives Physician Affirmation I have reviewed and agree with the above information. EVYNN, BOUTELLE ([id number]) Electronic Signature(s) Signed: 05/27/2017 5:24:28 PM By: Bonnell Public Signed: 05/28/2017 4:38:26 PM By: Alejandro Mulling Entered By: Bonnell Public on 05/27/2017 15:32:49 Lydia Buchanan ([id number]) -------------------------------------------------------------------------------- SuperBill Details Patient Name: Lydia Buchanan Date of Service: 05/27/2017 Medical Record Number: [id number] Patient Account Number: [id number] Date of Birth/Sex: 1932-06-15 (84 y.o. 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 Physician Procedures CPT4 Code: 6433295 Description: 99213 - WC PHYS LEVEL 3 - EST PT ICD-10 Diagnosis Description L97.212 Non-pressure chronic ulcer of right calf with  fat layer expo [id number] Non-pressure chronic ulcer of left calf with fat layer expos  I83.893 Varicose veins of bilateral lower  extremities with other com Modifier: sed ed plications Quantity: 1 Electronic Signature(s) Signed: 05/27/2017 3:33:27 PM By: Bonnell Public Previous Signature: 05/27/2017 2:42:08 PM Version By: Alejandro Mulling Entered By: Bonnell Public on 05/27/2017 15:33:26

## 2017-05-30 NOTE — Progress Notes (Signed)
Lydia, Buchanan ([id number]) Visit Report for 05/27/2017 Arrival Information Details Patient Name: Lydia Buchanan, Lydia Buchanan Date of Service: 05/27/2017 1:30 PM Medical Record Number: [id number] Patient Account Number: [id number] Date of Birth/Sex: [birth year] (84 y.o. F) Treating RN: Curtis Sites Primary Care Breauna Mazzeo: PATIENT, NO Other Clinician: Referring Daleyssa Loiselle: Franchot Mimes Treating Jearline Hirschhorn/Extender: Kathreen Cosier in Treatment: 2 Visit Information History Since Last Visit Added or deleted any medications: No Patient Arrived: Walker Any new allergies or adverse reactions: No Arrival Time: 13:44 Had a fall or experienced change in No Accompanied By: self activities of daily living that may affect Transfer Assistance: None risk of falls: Patient Identification Verified: Yes Signs or symptoms of abuse/neglect since last visito No Secondary Verification Process Completed: Yes Hospitalized since last visit: No Implantable device outside of the clinic excluding No cellular tissue based products placed in the center since last visit: Has Dressing in Place as Prescribed: Yes Has Compression in Place as Prescribed: Yes Pain Present Now: No Electronic Signature(s) Signed: 05/27/2017 4:44:42 PM By: Curtis Sites Entered By: Curtis Sites on 05/27/2017 13:47:33 Lydia Buchanan ([id number]) -------------------------------------------------------------------------------- Encounter Discharge Information Details Patient Name: Lydia Buchanan Date of Service: 05/27/2017 1:30 PM Medical Record Number: [id number] Patient Account Number: [id number] Date of Birth/Sex: 1932-08-24 (84 y.o. F) Treating RN: Renne Crigler Primary Care Yarima Penman: PATIENT, NO Other Clinician: Referring Annalucia Laino: Franchot Mimes Treating Kalley Nicholl/Extender: Kathreen Cosier in Treatment: 2 Encounter Discharge Information Items Discharge Condition: Stable Ambulatory Status: Walker Discharge Destination:  Home Transportation: Private Auto Schedule Follow-up Appointment: Yes Clinical Summary of Care: Electronic Signature(s) Signed: 05/27/2017 4:26:21 PM By: Renne Crigler Entered By: Renne Crigler on 05/27/2017 14:44:11 Lydia Buchanan ([id number]) -------------------------------------------------------------------------------- Lower Extremity Assessment Details Patient Name: Lydia Buchanan Date of Service: 05/27/2017 1:30 PM Medical Record Number: [id number] Patient Account Number: [id number] Date of Birth/Sex: 1932/11/17 (84 y.o. F) Treating RN: Curtis Sites Primary Care Hari Casaus: PATIENT, NO Other Clinician: Referring Glendale Youngblood: Franchot Mimes Treating Vicki Pasqual/Extender: Kathreen Cosier in Treatment: 2 Edema Assessment Assessed: [Left: No] [Right: No] E[Left: dema] [Right: :] Calf Left: Right: Point of Measurement: 32 cm From Medial Instep 32 cm 32.1 cm Ankle Left: Right: Point of Measurement: 10 cm From Medial Instep 20.5 cm 20.3 cm Vascular Assessment Pulses: Dorsalis Pedis Palpable: [Left:Yes] [Right:Yes] Posterior Tibial Extremity colors, hair growth, and conditions: Extremity Color: [Left:Hyperpigmented] [Right:Hyperpigmented] Hair Growth on Extremity: [Left:No] [Right:No] Temperature of Extremity: [Left:Warm] [Right:Warm] Capillary Refill: [Left:< 3 seconds] [Right:< 3 seconds] Toe Nail Assessment Left: Right: Thick: Yes Yes Discolored: No No Deformed: No No Improper Length and Hygiene: No No Electronic Signature(s) Signed: 05/27/2017 4:44:42 PM By: Curtis Sites Entered By: Curtis Sites on 05/27/2017 13:58:50 Lydia Buchanan ([id number]) -------------------------------------------------------------------------------- Multi Wound Chart Details Patient Name: Lydia Buchanan Date of Service: 05/27/2017 1:30 PM Medical Record Number: [id number] Patient Account Number: [id number] Date of Birth/Sex: 01-01-1933 (84 y.o. F) Treating RN: Phillis Haggis Primary Care Laketa Sandoz: PATIENT, NO Other Clinician: Referring Maudean Hoffmann: Franchot Mimes Treating Akesha Uresti/Extender: Kathreen Cosier in Treatment: 2 Vital Signs Height(in): 62 Pulse(bpm): 73 Weight(lbs): 112 Blood Pressure(mmHg): 132/70 Body Mass Index(BMI): 20 Temperature(F): 98.0 Respiratory Rate 16 (breaths/min): Photos: [1:No Photos] [2:No Photos] [3:No Photos] Wound Location: [1:Right Lower Leg - Lateral] [2:Right Lower Leg - Posterior] [3:Left Lower Leg - Anterior] Wounding Event: [1:Gradually Appeared] [2:Gradually Appeared] [3:Gradually Appeared] Primary Etiology: [1:Venous Leg Ulcer] [2:Venous Leg Ulcer] [3:Venous Leg Ulcer] Comorbid History: [1:Cataracts, Arrhythmia, Congestive Heart Failure, Hypertension] [2:Cataracts, Arrhythmia, Congestive Heart Failure, Hypertension] [3:Cataracts, Arrhythmia, Congestive Heart Failure, Hypertension] Date Acquired: [1:03/29/2017] [2:03/29/2017] [3:03/29/2017] Weeks of  Treatment: [1:2] [2:2] [3:2] Wound Status: [1:Open] [2:Open] [3:Open] Measurements L x W x D [1:0.7x0.6x0.2] [2:1.3x1.3x0.2] [3:1x1.4x0.1] (cm) Area (cm) : [1:0.33] [2:1.327] [3:1.1] Volume (cm) : [1:0.066] [2:0.265] [3:0.11] % Reduction in Area: [1:12.50%] [2:63.30%] [3:35.10%] % Reduction in Volume: [1:-73.70%] [2:26.60%] [3:35.30%] Classification: [1:Full Thickness Without Exposed Support Structures] [2:Full Thickness Without Exposed Support Structures] [3:Full Thickness Without Exposed Support Structures] Exudate Amount: [1:Small] [2:Large] [3:Large] Exudate Type: [1:Serous] [2:Serous] [3:Serous] Exudate Color: [1:amber] [2:amber] [3:amber] Wound Margin: [1:Flat and Intact] [2:Flat and Intact] [3:Flat and Intact] Granulation Amount: [1:Medium (34-66%)] [2:Medium (34-66%)] [3:Medium (34-66%)] Granulation Quality: [1:Pink] [2:Pink] [3:Red] Necrotic Amount: [1:Medium (34-66%)] [2:Medium (34-66%)] [3:Medium (34-66%)] Exposed Structures: [1:Fat Layer  (Subcutaneous Tissue) Exposed: Yes Fascia: No Tendon: No Muscle: No Joint: No Bone: No] [2:Fat Layer (Subcutaneous Tissue) Exposed: Yes Fascia: No Tendon: No Muscle: No Joint: No Bone: No] [3:Fat Layer (Subcutaneous Tissue) Exposed: Yes  Fascia: No Tendon: No Muscle: No Joint: No Bone: No] Epithelialization: [1:None] [2:None] [3:None] Periwound Skin Texture: [1:Excoriation: No Induration: No Callus: No Crepitus: No] [2:Excoriation: No Induration: No Callus: No Crepitus: No] [3:Excoriation: No Induration: No Callus: No Crepitus: No] Rash: No Rash: No Rash: No Scarring: No Scarring: No Scarring: No Periwound Skin Moisture: Maceration: No Maceration: No Maceration: No Dry/Scaly: No Dry/Scaly: No Dry/Scaly: No Periwound Skin Color: Hemosiderin Staining: Yes Hemosiderin Staining: Yes Hemosiderin Staining: Yes Atrophie Blanche: No Atrophie Blanche: No Atrophie Blanche: No Cyanosis: No Cyanosis: No Cyanosis: No Ecchymosis: No Ecchymosis: No Ecchymosis: No Erythema: No Erythema: No Erythema: No Mottled: No Mottled: No Mottled: No Pallor: No Pallor: No Pallor: No Rubor: No Rubor: No Rubor: No Temperature: No Abnormality No Abnormality No Abnormality Tenderness on Palpation: Yes Yes Yes Wound Preparation: Ulcer Cleansing: Wound Ulcer Cleansing: Wound Ulcer Cleansing: Wound Cleanser Cleanser Cleanser Topical Anesthetic Applied: Topical Anesthetic Applied: Topical Anesthetic Applied: Other: lidocaine 4% Other: lidocaine 4% Other: lidocaine 4% Treatment Notes Wound #1 (Right, Lateral Lower Leg) 1. Cleansed with: Clean wound with Normal Saline 2. Anesthetic Topical Lidocaine 4% cream to wound bed prior to debridement 4. Dressing Applied: Iodoflex 5. Secondary Dressing Applied ABD Pad 7. Secured with 3 Layer Compression System - Bilateral Wound #2 (Right, Posterior Lower Leg) 1. Cleansed with: Clean wound with Normal Saline 2. Anesthetic Topical Lidocaine 4%  cream to wound bed prior to debridement 4. Dressing Applied: Iodoflex 5. Secondary Dressing Applied ABD Pad 7. Secured with 3 Layer Compression System - Bilateral Wound #3 (Left, Anterior Lower Leg) 1. Cleansed with: Clean wound with Normal Saline 2. Anesthetic Topical Lidocaine 4% cream to wound bed prior to debridement 4. Dressing Applied: Iodoflex 5. Secondary Dressing Applied ABD Pad Chimento, Jacarra ([id number]) 7. Secured with 3 Layer Compression System - Bilateral Electronic Signature(s) Signed: 05/27/2017 3:31:14 PM By: Bonnell Public Entered By: Bonnell Public on 05/27/2017 15:31:14 Lydia Buchanan ([id number]) -------------------------------------------------------------------------------- Multi-Disciplinary Care Plan Details Patient Name: Lydia Buchanan Date of Service: 05/27/2017 1:30 PM Medical Record Number: [id number] Patient Account Number: [id number] Date of Birth/Sex: 1932/11/13 (84 y.o. F) Treating RN: Phillis Haggis Primary Care Liora Myles: PATIENT, NO Other Clinician: Referring Burtis Imhoff: Franchot Mimes Treating Klyde Banka/Extender: Kathreen Cosier in Treatment: 2 Active Inactive ` Abuse / Safety / Falls / Self Care Management Nursing Diagnoses: Potential for falls Goals: Patient will not experience any injury related to falls Date Initiated: 05/13/2017 Target Resolution Date: 08/21/2017 Goal Status: Active Interventions: Assess Activities of Daily Living upon admission and as needed Assess fall risk on admission and as needed Assess: immobility, friction, shearing, incontinence upon admission and  as needed Assess impairment of mobility on admission and as needed per policy Assess personal safety and home safety (as indicated) on admission and as needed Assess self care needs on admission and as needed Notes: ` Nutrition Nursing Diagnoses: Imbalanced nutrition Potential for alteratiion in Nutrition/Potential for imbalanced  nutrition Goals: Patient/caregiver agrees to and verbalizes understanding of need to use nutritional supplements and/or vitamins as prescribed Date Initiated: 05/13/2017 Target Resolution Date: 09/18/2017 Goal Status: Active Interventions: Assess patient nutrition upon admission and as needed per policy Notes: ` Orientation to the Wound Care Program Nursing Diagnoses: Knowledge deficit related to the wound healing center program NOVALYNN, BRANAMAN ([id number]) Goals: Patient/caregiver will verbalize understanding of the Wound Healing Center Program Date Initiated: 05/13/2017 Target Resolution Date: 06/19/2017 Goal Status: Active Interventions: Provide education on orientation to the wound center Notes: ` Pain, Acute or Chronic Nursing Diagnoses: Pain, acute or chronic: actual or potential Potential alteration in comfort, pain Goals: Patient/caregiver will verbalize adequate pain control between visits Date Initiated: 05/13/2017 Target Resolution Date: 09/18/2017 Goal Status: Active Interventions: Complete pain assessment as per visit requirements Notes: ` Wound/Skin Impairment Nursing Diagnoses: Impaired tissue integrity Knowledge deficit related to ulceration/compromised skin integrity Goals: Ulcer/skin breakdown will have a volume reduction of 80% by week 12 Date Initiated: 05/13/2017 Target Resolution Date: 09/11/2017 Goal Status: Active Interventions: Assess patient/caregiver ability to perform ulcer/skin care regimen upon admission and as needed Assess ulceration(s) every visit Notes: Electronic Signature(s) Signed: 05/28/2017 4:38:26 PM By: Alejandro Mulling Entered By: Alejandro Mulling on 05/27/2017 14:08:19 Lydia Buchanan ([id number]) -------------------------------------------------------------------------------- Pain Assessment Details Patient Name: Lydia Buchanan Date of Service: 05/27/2017 1:30 PM Medical Record Number: [id number] Patient Account Number:  [id number] Date of Birth/Sex: [birth year] (84 y.o. F) Treating RN: Curtis Sites Primary Care Rodneisha Bonnet: PATIENT, NO Other Clinician: Referring Felicha Frayne: Franchot Mimes Treating Helaina Stefano/Extender: Kathreen Cosier in Treatment: 2 Active Problems Location of Pain Severity and Description of Pain Patient Has Paino No Site Locations Pain Management and Medication Current Pain Management: Electronic Signature(s) Signed: 05/27/2017 4:44:42 PM By: Curtis Sites Entered By: Curtis Sites on 05/27/2017 13:47:40 Lydia Buchanan ([id number]) -------------------------------------------------------------------------------- Patient/Caregiver Education Details Patient Name: Lydia Buchanan Date of Service: 05/27/2017 1:30 PM Medical Record Number: [id number] Patient Account Number: [id number] Date of Birth/Gender: 10/26/1932 (84 y.o. F) Treating RN: Renne Crigler Primary Care Physician: PATIENT, NO Other Clinician: Referring Physician: Franchot Mimes Treating Physician/Extender: Kathreen Cosier in Treatment: 2 Education Assessment Education Provided To: Patient Education Topics Provided Wound/Skin Impairment: Handouts: Caring for Your Ulcer Methods: Explain/Verbal Responses: State content correctly Electronic Signature(s) Signed: 05/27/2017 4:26:21 PM By: Renne Crigler Entered By: Renne Crigler on 05/27/2017 14:44:30 Lydia Buchanan ([id number]) -------------------------------------------------------------------------------- Wound Assessment Details Patient Name: Lydia Buchanan Date of Service: 05/27/2017 1:30 PM Medical Record Number: [id number] Patient Account Number: [id number] Date of Birth/Sex: 20-Dec-1932 (84 y.o. F) Treating RN: Curtis Sites Primary Care Ulises Wolfinger: PATIENT, NO Other Clinician: Referring Keyauna Graefe: Franchot Mimes Treating Latishia Suitt/Extender: Kathreen Cosier in Treatment: 2 Wound Status Wound Number: 1 Primary Venous Leg Ulcer Etiology: Wound  Location: Right Lower Leg - Lateral Wound Status: Open Wounding Event: Gradually Appeared Comorbid Cataracts, Arrhythmia, Congestive Heart Date Acquired: 03/29/2017 History: Failure, Hypertension Weeks Of Treatment: 2 Clustered Wound: No Photos Photo Uploaded By: Curtis Sites on 05/27/2017 16:14:00 Wound Measurements Length: (cm) 0.7 Width: (cm) 0.6 Depth: (cm) 0.2 Area: (cm) 0.33 Volume: (cm) 0.066 % Reduction in Area: 12.5% % Reduction in Volume: -73.7% Epithelialization: None Tunneling: No Undermining: No Wound Description Full Thickness Without Exposed Support Classification: Structures Wound  Margin: Flat and Intact Exudate Small Amount: Exudate Type: Serous Exudate Color: amber Foul Odor After Cleansing: No Slough/Fibrino Yes Wound Bed Granulation Amount: Medium (34-66%) Exposed Structure Granulation Quality: Pink Fascia Exposed: No Necrotic Amount: Medium (34-66%) Fat Layer (Subcutaneous Tissue) Exposed: Yes Necrotic Quality: Adherent Slough Tendon Exposed: No Muscle Exposed: No Joint Exposed: No Bone Exposed: No Vallo, Makayle ([id number]) Periwound Skin Texture Texture Color No Abnormalities Noted: No No Abnormalities Noted: No Callus: No Atrophie Blanche: No Crepitus: No Cyanosis: No Excoriation: No Ecchymosis: No Induration: No Erythema: No Rash: No Hemosiderin Staining: Yes Scarring: No Mottled: No Pallor: No Moisture Rubor: No No Abnormalities Noted: No Dry / Scaly: No Temperature / Pain Maceration: No Temperature: No Abnormality Tenderness on Palpation: Yes Wound Preparation Ulcer Cleansing: Wound Cleanser Topical Anesthetic Applied: Other: lidocaine 4%, Treatment Notes Wound #1 (Right, Lateral Lower Leg) 1. Cleansed with: Clean wound with Normal Saline 2. Anesthetic Topical Lidocaine 4% cream to wound bed prior to debridement 4. Dressing Applied: Iodoflex 5. Secondary Dressing Applied ABD Pad 7. Secured with 3  Layer Compression System - Bilateral Electronic Signature(s) Signed: 05/27/2017 4:44:42 PM By: Curtis Sites Entered By: Curtis Sites on 05/27/2017 14:03:47 Lydia Buchanan ([id number]) -------------------------------------------------------------------------------- Wound Assessment Details Patient Name: Lydia Buchanan Date of Service: 05/27/2017 1:30 PM Medical Record Number: [id number] Patient Account Number: [id number] Date of Birth/Sex: 01-23-1932 (84 y.o. F) Treating RN: Curtis Sites Primary Care Seyon Strader: PATIENT, NO Other Clinician: Referring Mertis Mosher: Franchot Mimes Treating Alphia Behanna/Extender: Kathreen Cosier in Treatment: 2 Wound Status Wound Number: 2 Primary Venous Leg Ulcer Etiology: Wound Location: Right Lower Leg - Posterior Wound Status: Open Wounding Event: Gradually Appeared Comorbid Cataracts, Arrhythmia, Congestive Heart Date Acquired: 03/29/2017 History: Failure, Hypertension Weeks Of Treatment: 2 Clustered Wound: No Photos Photo Uploaded By: Curtis Sites on 05/27/2017 16:14:00 Wound Measurements Length: (cm) 1.3 Width: (cm) 1.3 Depth: (cm) 0.2 Area: (cm) 1.327 Volume: (cm) 0.265 % Reduction in Area: 63.3% % Reduction in Volume: 26.6% Epithelialization: None Tunneling: No Undermining: No Wound Description Full Thickness Without Exposed Support Classification: Structures Wound Margin: Flat and Intact Exudate Large Amount: Exudate Type: Serous Exudate Color: amber Foul Odor After Cleansing: No Slough/Fibrino Yes Wound Bed Granulation Amount: Medium (34-66%) Exposed Structure Granulation Quality: Pink Fascia Exposed: No Necrotic Amount: Medium (34-66%) Fat Layer (Subcutaneous Tissue) Exposed: Yes Necrotic Quality: Adherent Slough Tendon Exposed: No Muscle Exposed: No Joint Exposed: No Bone Exposed: No [place], Brittiny ([id number]) Periwound Skin Texture Texture Color No Abnormalities Noted: No No Abnormalities Noted:  No Callus: No Atrophie Blanche: No Crepitus: No Cyanosis: No Excoriation: No Ecchymosis: No Induration: No Erythema: No Rash: No Hemosiderin Staining: Yes Scarring: No Mottled: No Pallor: No Moisture Rubor: No No Abnormalities Noted: No Dry / Scaly: No Temperature / Pain Maceration: No Temperature: No Abnormality Tenderness on Palpation: Yes Wound Preparation Ulcer Cleansing: Wound Cleanser Topical Anesthetic Applied: Other: lidocaine 4%, Treatment Notes Wound #2 (Right, Posterior Lower Leg) 1. Cleansed with: Clean wound with Normal Saline 2. Anesthetic Topical Lidocaine 4% cream to wound bed prior to debridement 4. Dressing Applied: Iodoflex 5. Secondary Dressing Applied ABD Pad 7. Secured with 3 Layer Compression System - Bilateral Electronic Signature(s) Signed: 05/27/2017 4:44:42 PM By: Curtis Sites Entered By: Curtis Sites on 05/27/2017 14:04:32 Lydia Buchanan ([id number]) -------------------------------------------------------------------------------- Wound Assessment Details Patient Name: Lydia Buchanan Date of Service: 05/27/2017 1:30 PM Medical Record Number: [id number] Patient Account Number: [id number] Date of Birth/Sex: [birth year] (84 y.o. F) Treating RN: Curtis Sites Primary Care Arman Loy: PATIENT, NO Other Clinician: Referring Amarilis Belflower:  BISSELL, BRAD Treating Isiaha Greenup/Extender: Bonnell Public Weeks in Treatment: 2 Wound Status Wound Number: 3 Primary Venous Leg Ulcer Etiology: Wound Location: Left Lower Leg - Anterior Wound Status: Open Wounding Event: Gradually Appeared Comorbid Cataracts, Arrhythmia, Congestive Heart Date Acquired: 03/29/2017 History: Failure, Hypertension Weeks Of Treatment: 2 Clustered Wound: No Photos Photo Uploaded By: Curtis Sites on 05/27/2017 16:19:50 Wound Measurements Length: (cm) 1 Width: (cm) 1.4 Depth: (cm) 0.1 Area: (cm) 1.1 Volume: (cm) 0.11 % Reduction in Area: 35.1% % Reduction in  Volume: 35.3% Epithelialization: None Tunneling: No Undermining: No Wound Description Full Thickness Without Exposed Support Classification: Structures Wound Margin: Flat and Intact Exudate Large Amount: Exudate Type: Serous Exudate Color: amber Foul Odor After Cleansing: No Slough/Fibrino Yes Wound Bed Granulation Amount: Medium (34-66%) Exposed Structure Granulation Quality: Red Fascia Exposed: No Necrotic Amount: Medium (34-66%) Fat Layer (Subcutaneous Tissue) Exposed: Yes Necrotic Quality: Adherent Slough Tendon Exposed: No Muscle Exposed: No Joint Exposed: No Bone Exposed: No Ivie, Ziaire ([id number]) Periwound Skin Texture Texture Color No Abnormalities Noted: No No Abnormalities Noted: No Callus: No Atrophie Blanche: No Crepitus: No Cyanosis: No Excoriation: No Ecchymosis: No Induration: No Erythema: No Rash: No Hemosiderin Staining: Yes Scarring: No Mottled: No Pallor: No Moisture Rubor: No No Abnormalities Noted: No Dry / Scaly: No Temperature / Pain Maceration: No Temperature: No Abnormality Tenderness on Palpation: Yes Wound Preparation Ulcer Cleansing: Wound Cleanser Topical Anesthetic Applied: Other: lidocaine 4%, Treatment Notes Wound #3 (Left, Anterior Lower Leg) 1. Cleansed with: Clean wound with Normal Saline 2. Anesthetic Topical Lidocaine 4% cream to wound bed prior to debridement 4. Dressing Applied: Iodoflex 5. Secondary Dressing Applied ABD Pad 7. Secured with 3 Layer Compression System - Bilateral Electronic Signature(s) Signed: 05/27/2017 4:44:42 PM By: Curtis Sites Entered By: Curtis Sites on 05/27/2017 14:04:45 Lydia Buchanan ([id number]) -------------------------------------------------------------------------------- Vitals Details Patient Name: Lydia Buchanan Date of Service: 05/27/2017 1:30 PM Medical Record Number: [id number] Patient Account Number: [id number] Date of Birth/Sex: November 12, 1932 (84 y.o.  F) Treating RN: Curtis Sites Primary Care Earlyne Feeser: PATIENT, NO Other Clinician: Referring Lativia Velie: Franchot Mimes Treating Ric Rosenberg/Extender: Kathreen Cosier in Treatment: 2 Vital Signs Time Taken: 13:47 Temperature (F): 98.0 Height (in): 62 Pulse (bpm): 73 Weight (lbs): 112 Respiratory Rate (breaths/min): 16 Body Mass Index (BMI): 20.5 Blood Pressure (mmHg): 132/70 Reference Range: 80 - 120 mg / dl Electronic Signature(s) Signed: 05/27/2017 4:44:42 PM By: Curtis Sites Entered By: Curtis Sites on 05/27/2017 13:50:17

## 2017-05-31 ENCOUNTER — Emergency Department
Admission: EM | Admit: 2017-05-31 | Discharge: 2017-06-01 | Disposition: A | Discharge status: Home / Self Care | Payer: Medicare Other | Attending: Emergency Medicine | Admitting: Emergency Medicine

## 2017-05-31 ENCOUNTER — Other Ambulatory Visit: Payer: Self-pay

## 2017-05-31 DIAGNOSIS — I4891 Unspecified atrial fibrillation: Secondary | ICD-10-CM | POA: Diagnosis not present

## 2017-05-31 DIAGNOSIS — F419 Anxiety disorder, unspecified: Secondary | ICD-10-CM | POA: Diagnosis not present

## 2017-05-31 DIAGNOSIS — R002 Palpitations: Secondary | ICD-10-CM | POA: Insufficient documentation

## 2017-05-31 DIAGNOSIS — Z87891 Personal history of nicotine dependence: Secondary | ICD-10-CM | POA: Insufficient documentation

## 2017-05-31 DIAGNOSIS — I509 Heart failure, unspecified: Secondary | ICD-10-CM | POA: Diagnosis not present

## 2017-05-31 DIAGNOSIS — I110 Hypertensive heart disease with heart failure: Secondary | ICD-10-CM | POA: Diagnosis not present

## 2017-05-31 DIAGNOSIS — F332 Major depressive disorder, recurrent severe without psychotic features: Secondary | ICD-10-CM | POA: Diagnosis not present

## 2017-05-31 DIAGNOSIS — F410 Panic disorder [episodic paroxysmal anxiety] without agoraphobia: Secondary | ICD-10-CM | POA: Diagnosis not present

## 2017-05-31 DIAGNOSIS — Z8673 Personal history of transient ischemic attack (TIA), and cerebral infarction without residual deficits: Secondary | ICD-10-CM | POA: Diagnosis not present

## 2017-05-31 DIAGNOSIS — F411 Generalized anxiety disorder: Secondary | ICD-10-CM | POA: Diagnosis present

## 2017-05-31 LAB — COMPREHENSIVE METABOLIC PANEL
ALBUMIN: 3.7 g/dL (ref 3.5–5.0)
ALT: 16 U/L (ref 14–54)
ANION GAP: 7 (ref 5–15)
AST: 29 U/L (ref 15–41)
Alkaline Phosphatase: 61 U/L (ref 38–126)
BILIRUBIN TOTAL: 0.9 mg/dL (ref 0.3–1.2)
BUN: 18 mg/dL (ref 6–20)
CALCIUM: 8.4 mg/dL — AB (ref 8.9–10.3)
CO2: 27 mmol/L (ref 22–32)
Chloride: 104 mmol/L (ref 101–111)
Creatinine, Ser: 0.99 mg/dL (ref 0.44–1.00)
GFR calc Af Amer: 60 mL/min — ABNORMAL LOW (ref >60)
GFR calc non Af Amer: 52 mL/min — ABNORMAL LOW (ref >60)
GLUCOSE: 96 mg/dL (ref 65–99)
Potassium: 3.2 mmol/L — ABNORMAL LOW (ref 3.5–5.1)
Sodium: 138 mmol/L (ref 135–145)
TOTAL PROTEIN: 7.2 g/dL (ref 6.5–8.1)

## 2017-05-31 LAB — CBC
HCT: 35.6 % (ref 35.0–47.0)
HEMOGLOBIN: 12.3 g/dL (ref 12.0–16.0)
MCH: 30.1 pg (ref 26.0–34.0)
MCHC: 34.5 g/dL (ref 32.0–36.0)
MCV: 87.2 fL (ref 80.0–100.0)
Platelets: 176 10*3/uL (ref 150–440)
RBC: 4.08 MIL/uL (ref 3.80–5.20)
RDW: 16.1 % — AB (ref 11.5–14.5)
WBC: 6.5 10*3/uL (ref 3.6–11.0)

## 2017-05-31 LAB — TROPONIN I: Troponin I: <0.03 ng/mL (ref <0.03)

## 2017-05-31 MED ORDER — CLONAZEPAM 0.5 MG PO TABS
0.2500 mg | ORAL_TABLET | Freq: Once | ORAL | Status: AC
  Administered 2017-05-31: 0.25 mg via ORAL
  Filled 2017-05-31 (×2): qty 1

## 2017-05-31 MED ORDER — CARVEDILOL 6.25 MG PO TABS
12.5000 mg | ORAL_TABLET | Freq: Two times a day (BID) | ORAL | Status: DC
  Administered 2017-05-31 – 2017-06-01 (×4): 12.5 mg via ORAL
  Filled 2017-05-31 (×4): qty 2

## 2017-05-31 MED ORDER — CLONAZEPAM 0.5 MG PO TABS: 0.2500 mg | ORAL_TABLET | Freq: Every day | ORAL | Status: DC

## 2017-05-31 MED ORDER — CLONAZEPAM 0.5 MG PO TABS
0.2500 mg | ORAL_TABLET | Freq: Once | ORAL | Status: AC
  Administered 2017-05-31: 0.25 mg via ORAL
  Filled 2017-05-31: qty 1
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
## 2017-05-31 NOTE — ED Notes (Signed)
Hourly rounding reveals patient sleeping in room. No complaints, stable, in no acute distress. Q15 minute rounds and monitoring via Rover and Officer to continue.  

## 2017-05-31 NOTE — ED Notes (Signed)
Patient's son would like to be updated when patient has been placed for inpatient treatment.  Lydia Buchanan [phone]

## 2017-05-31 NOTE — BH Assessment (Signed)
Referral information for Psychiatric Hospitalization faxed to;   [facility] [place] [phone]),   . [facility] ([phone]),  . [facility] [phone]),   . [facility] [phone]),   . [facility] [phone])  . [place] [phone] or [phone]),   . [facility] [phone])   . [facility] ([phone]), Declined due to lack of acuity.

## 2017-05-31 NOTE — ED Triage Notes (Signed)
Pt arrives via ems from [facility]. Ems report anxiety and "racing heart". Pt states she woke up and felt like heart was racing. Very apologetic during triage, fearful that staff and hospital will be mad that she came to the hospital. NAD noted. Pt states she has a fear of being left alone in the room.

## 2017-05-31 NOTE — ED Notes (Signed)
Pt continues to be very anxious. Offered emotional support and reassurance by staff members. Pt has spoken with her son and sister on the phone. Maintained on 15 minute checks and observation by security camera for safety.

## 2017-05-31 NOTE — ED Notes (Signed)
PT VOLUNTARY PENDING PLACEMENT. 

## 2017-05-31 NOTE — ED Notes (Signed)
Pt repeatedly asking if RN was coming back, states she is scared to be left alone. Pt reassured that RN would check in periodically, call light within reach of pt, tv turned on with music playing to calm patient.

## 2017-05-31 NOTE — ED Notes (Signed)
Report to include Situation, Background, Assessment, and Recommendations received from Amy B. RN. Patient alert and oriented, warm and dry, in no acute distress. Patient denies SI, HI, AVH and pain. Patient made aware of Q15 minute rounds and Rover and Officer presence for their safety. Patient instructed to come to me with needs or concerns.  

## 2017-05-31 NOTE — ED Notes (Signed)
Pt cooperative, but very anxious. Pt continues to state she doesn't not want to be left alone in her room. RN assured the patient staff would be checking on her frequently. Pt given lunch tray and drink. Maintained on 15 minute checks and observation by security camera for safety.

## 2017-05-31 NOTE — ED Notes (Signed)
Pt. C/o anxiety. 

## 2017-05-31 NOTE — ED Notes (Signed)
Pt prefers pills crunched in applesauce.

## 2017-05-31 NOTE — ED Notes (Signed)
Pt given water by request and then ask this tech to remove the ice. Pt given cup of water only.

## 2017-05-31 NOTE — ED Provider Notes (Signed)
[facility] Emergency Department Provider Note       Time seen: ----------------------------------------- 7:51 AM on 05/31/2017 -----------------------------------------   I have reviewed the triage vital signs and the nursing notes.  HISTORY   Chief Complaint No chief complaint on file.  HPI Lydia Buchanan is a 84 y.o. female with a history of atrial fibrillation, anxiety, CHF, hypertension, thyroid disease and TIA who presents to the ED for anxiety and heart racing.  Patient states she woke up and felt like her heart was racing.  She is very apologetic on arrival, fearful that staff is upset to see her here again.  She denies fevers, chills, chest pain or shortness of breath.  Past Medical History:  Diagnosis Date  . A-fib (HCC)   . Anxiety   . CHF (congestive heart failure) (HCC)   . Hypertension   . Thyroid disease   . TIA (transient ischemic attack)     Patient Active Problem List   Diagnosis Date Noted  . Generalized anxiety disorder 05/11/2017  . Panic attacks 05/11/2017    Past Surgical History:  Procedure Laterality Date  . ABDOMINAL HYSTERECTOMY    . BACK SURGERY      Allergies Macrobid [nitrofurantoin macrocrystal]; Oxycodone; and Xanax [alprazolam]  Social History Social History   Tobacco Use  . Smoking status: Former Games developer  . Smokeless tobacco: Never Used  Substance Use Topics  . Alcohol use: Not Currently  . Drug use: Never   Review of Systems Constitutional: Negative for fever. Cardiovascular: Negative for chest pain.  Positive for palpitations Respiratory: Negative for shortness of breath. Gastrointestinal: Negative for abdominal pain, vomiting and diarrhea. Musculoskeletal: Negative for back pain. Skin: Negative for rash. Neurological: Negative for headaches, focal weakness or numbness. Psychiatric: Positive for anxiety  All systems negative/normal/unremarkable except as stated in the  HPI  ____________________________________________   PHYSICAL EXAM:  VITAL SIGNS: ED Triage Vitals  Enc Vitals Group     BP      Pulse      Resp      Temp      Temp src      SpO2      Weight      Height      Head Circumference      Peak Flow      Pain Score      Pain Loc      Pain Edu?      Excl. in GC?    Constitutional: Alert and oriented. Well appearing and in no distress. Eyes: Conjunctivae are normal. Normal extraocular movements. ENT   Head: Normocephalic and atraumatic.   Nose: No congestion/rhinnorhea.   Mouth/Throat: Mucous membranes are moist.   Neck: No stridor. Cardiovascular: Normal rate, regular rhythm. No murmurs, rubs, or gallops. Respiratory: Normal respiratory effort without tachypnea nor retractions. Breath sounds are clear and equal bilaterally. No wheezes/rales/rhonchi. Gastrointestinal: Soft and nontender. Normal bowel sounds Musculoskeletal: Nontender with normal range of motion in extremities. No lower extremity tenderness nor edema. Neurologic:  Normal speech and language. No gross focal neurologic deficits are appreciated.  Skin:  Skin is warm, dry and intact. No rash noted. Psychiatric: Mood and affect are normal. Speech and behavior are normal.  ____________________________________________  EKG: Interpreted by me.  Atrial fibrillation with a rate of 78 bpm, borderline long QT, normal axis.  ____________________________________________  ED COURSE:  As part of my medical decision making, I reviewed the following data within the electronic MEDICAL RECORD NUMBER History obtained from family  if available, nursing notes, old chart and ekg, as well as notes from prior ED visits. Patient presented for palpitations and possible anxiety, we will assess with labs and imaging as indicated at this time. Clinical Course as of May 31 844  Mon May 31, 2017  7253 Patient's family is requesting a psychiatric consult due to persistent anxiety for  possible medication adjustment   [JW]    Clinical Course User Index [JW] Emily Filbert, MD   Procedures ____________________________________________   LABS (pertinent positives/negatives)  Labs Reviewed  CBC - Abnormal; Notable for the following components:      Result Value   RDW 16.1 (*)    All other components within normal limits  COMPREHENSIVE METABOLIC PANEL - Abnormal; Notable for the following components:   Potassium 3.2 (*)    Calcium 8.4 (*)    GFR calc non Af Amer 52 (*)    GFR calc Af Amer 60 (*)    All other components within normal limits  TROPONIN I  ____________________________________________  DIFFERENTIAL DIAGNOSIS   Palpitations, anxiety, arrhythmia, electrolyte abnormality  FINAL ASSESSMENT AND PLAN  Palpitations, anxiety   Plan: The patient had presented for palpitations. Patient's labs did not reveal any acute process.  As dictated above a big part of her presentation here revolves around anxiety.  Family is concerned she may lose her current living situation due to persistent anxiety.  We are pending a psychiatric consultation for medication adjustment.  She did receive a dose of Klonopin here.   Ulice Dash, MD   Note: This note was generated in part or whole with voice recognition software. Voice recognition is usually quite accurate but there are transcription errors that can and very often do occur. I apologize for any typographical errors that were not detected and corrected.     Emily Filbert, MD 05/31/17 [phone]

## 2017-05-31 NOTE — ED Notes (Signed)
Hourly rounding reveals patient in room. Still c/o anxiety, stable, in no acute distress. Q15 minute rounds and monitoring via Psychologist, counselling to continue.

## 2017-05-31 NOTE — ED Provider Notes (Addendum)
Patient was Evaluated by psychiatry, we will proceed with Gero-psych placement.   Emily Filbert, MD 05/31/17 1244   Emily Filbert, MD 05/31/17 [phone]
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
## 2017-06-01 ENCOUNTER — Other Ambulatory Visit: Payer: Self-pay

## 2017-06-01 DIAGNOSIS — F332 Major depressive disorder, recurrent severe without psychotic features: Secondary | ICD-10-CM | POA: Diagnosis not present

## 2017-06-01 DIAGNOSIS — R002 Palpitations: Secondary | ICD-10-CM | POA: Diagnosis not present

## 2017-06-01 DIAGNOSIS — I83018 Varicose veins of right lower extremity with ulcer other part of lower leg: Secondary | ICD-10-CM | POA: Diagnosis not present

## 2017-06-01 MED ORDER — FUROSEMIDE 40 MG PO TABS: 40.0000 mg | ORAL_TABLET | Freq: Every day | ORAL | Status: DC

## 2017-06-01 MED ORDER — CLONAZEPAM 0.5 MG PO TABS: 0.2500 mg | ORAL_TABLET | Freq: Once | ORAL | Status: DC

## 2017-06-01 MED ORDER — CLONAZEPAM 0.5 MG PO TABS: 0.5000 mg | ORAL_TABLET | Freq: Every day | ORAL | Status: DC

## 2017-06-01 MED ORDER — LEVOTHYROXINE SODIUM 50 MCG PO TABS: 100.0000 ug | ORAL_TABLET | Freq: Every day | ORAL | Status: DC

## 2017-06-01 MED ORDER — LOSARTAN POTASSIUM 50 MG PO TABS: 25.0000 mg | ORAL_TABLET | Freq: Every day | ORAL | Status: DC

## 2017-06-01 MED ORDER — PAROXETINE HCL 20 MG PO TABS
20.0000 mg | ORAL_TABLET | Freq: Every day | ORAL | Status: DC
  Filled 2017-06-01: qty 1

## 2017-06-01 MED ORDER — ROSUVASTATIN CALCIUM 20 MG PO TABS: 20.0000 mg | ORAL_TABLET | Freq: Every day | ORAL | Status: DC

## 2017-06-01 MED ORDER — CARVEDILOL 6.25 MG PO TABS: 12.5000 mg | ORAL_TABLET | Freq: Two times a day (BID) | ORAL | Status: DC

## 2017-06-01 MED ORDER — ACETAMINOPHEN 325 MG PO TABS
650.0000 mg | ORAL_TABLET | Freq: Once | ORAL | Status: AC
  Administered 2017-06-01: 650 mg via ORAL
  Filled 2017-06-01: qty 2

## 2017-06-01 MED ORDER — CLONAZEPAM 0.5 MG PO TABS
0.2500 mg | ORAL_TABLET | Freq: Two times a day (BID) | ORAL | Status: DC | PRN
  Administered 2017-06-01 (×2): 0.25 mg via ORAL
  Filled 2017-06-01 (×3): qty 1

## 2017-06-01 MED ORDER — BUSPIRONE HCL 5 MG PO TABS: 15.0000 mg | ORAL_TABLET | Freq: Three times a day (TID) | ORAL | Status: DC

## 2017-06-01 MED ORDER — ONDANSETRON HCL 4 MG PO TABS
4.0000 mg | ORAL_TABLET | Freq: Once | ORAL | Status: AC
  Administered 2017-06-01: 4 mg via ORAL
  Filled 2017-06-01: qty 1

## 2017-06-01 NOTE — Progress Notes (Signed)
Psychiatric Hospitalization Referral Update:   Re-faxed pt's referral information to the following hospitals, as they stated they had not received pt's information:   [facility] [phone])   [facility] [phone])

## 2017-06-01 NOTE — ED Notes (Signed)
Pt ambulated with walker to bathroom to void.

## 2017-06-01 NOTE — ED Notes (Signed)
Hourly rounding reveals patient sleeping in room. No complaints, stable, in no acute distress. Q15 minute rounds and monitoring via Rover and Officer to continue.  

## 2017-06-01 NOTE — ED Notes (Signed)
Pt given breakfast tray. Ate 75% of meal.

## 2017-06-01 NOTE — ED Notes (Signed)
Emailed the pharmacy to send the Klonopin.

## 2017-06-01 NOTE — ED Provider Notes (Signed)
patient reports she had been on blood thinners per Drs. took him off of them because she had bleeding ulcers from her legs and had trouble getting the bleeding stopped. She is very anxious  Going back on blood thinners again. She has been seeing the wound center in fact saw them just a few days ago.I will not restart her on blood thinners at this point.   Arnaldo Natal, MD 06/01/17 [phone]

## 2017-06-01 NOTE — ED Notes (Signed)
Patient is constantly calling staff to her room.States "no one is doing anything on my legs,this dressing is need to be unwrapped.I am sacred in this room by myself.Can you sit with me."Told patient that the dressing is need to be changed by Thursday.Support and encouragement given.

## 2017-06-01 NOTE — ED Notes (Signed)
Patient discharge with son. Patient denies SI/HI/AVH. Discharge paperwork reviewed with patient and son as caregiver.

## 2017-06-01 NOTE — ED Provider Notes (Signed)
patient reports she gets the dressings on her leg changed every week. Review of the old records show she had them done on the 16th which means she'll need them redone in 2 more days. We do not have Iodoflex in the hospital all we have is Unna boots. I have asked that two Unna boots be sent up from central supply so that we can have them available to change dressings in 2 days.   Arnaldo Natal, MD 06/01/17 1501

## 2017-06-01 NOTE — ED Notes (Signed)
Patient visiting with her son.

## 2017-06-01 NOTE — ED Provider Notes (Signed)
EKG read and interpreted by me shows atrial fibrillation at a rate of 76 left axis no acute ST-T changes. A. fib is not new for this patient   Arnaldo Natal, MD 06/01/17 [phone]

## 2017-06-01 NOTE — ED Notes (Signed)
Pt given lunch tray.
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
## 2017-06-01 NOTE — Progress Notes (Signed)
Psychiatric Hospitalization Referral Update:    [place]via [place] ([phone]) - spoke to [facility]; re-send referral, they don't have it   Earlene Plater [phone]) - spoke to [facility]; declined, "did not meet criteria"   Aw[facility] [phone]) - spoke to [facility]; lacks acuity   [place] [phone]) - spoke to [place]; at [employer] [phone]) - spoke to [place]; re-send referral, they don't have it   Sandre Kitty [phone]) - spoke to [facility]; at capacity   [place] [phone]) - spoke to [place]; at [facility] ([phone]), Declined due to lack of acuity.

## 2017-06-01 NOTE — ED Notes (Addendum)
Toneshia Coello (pt son) [phone] would like to be notified prior to pt discharge or transfer

## 2017-06-01 NOTE — ED Provider Notes (Signed)
-----------------------------------------   7:32 AM on 06/01/2017 -----------------------------------------   Blood pressure (!) 181/93, pulse 89, temperature 97.6 F (36.4 C), temperature source Oral, resp. rate 20, height  (1.651 m), weight 50.8 kg (112 lb), SpO2 97 %.  The patient had no acute events since last update.  Calm and cooperative at this time.  Disposition is pending Psychiatry/Behavioral Medicine team recommendations.Will continue to monitor patient's blood pressure she is on antihypertensive that had been ordered for her. She may need more.     Arnaldo Natal, MD 06/01/17 [phone]

## 2017-06-01 NOTE — ED Provider Notes (Signed)
His son at bedside requesting to take patient home.  She is otherwise hemodynamically stable.  States that he has plans to have her follow-up with PCP tomorrow morning where they will further discuss placement versus inpatient admission.  She is not under IVC and appropriate for outpatient follow up.     Willy Eddy, MD 06/01/17 1859

## 2017-06-01 NOTE — ED Notes (Signed)
Pt ambulated to bathroom with wheelchair.

## 2017-06-03 ENCOUNTER — Ambulatory Visit: Payer: Medicare Other | Admitting: Nurse Practitioner

## 2017-06-03 ENCOUNTER — Encounter: Payer: Medicare Other | Admitting: Nurse Practitioner

## 2017-06-03 DIAGNOSIS — I83018 Varicose veins of right lower extremity with ulcer other part of lower leg: Secondary | ICD-10-CM | POA: Diagnosis not present

## 2017-06-05 NOTE — Progress Notes (Signed)
MAZIAH, SMOLA ([id number]) Visit Report for 06/03/2017 Chief Complaint Document Details Patient Name: Lydia Buchanan, Lydia Buchanan Date of Service: 06/03/2017 1:15 PM Medical Record Number: [id number] Patient Account Number: [id number] Date of Birth/Sex: Jun 02, 1932 (84 y.o. F) Treating RN: Renne Crigler Primary Care Provider: PATIENT, NO Other Clinician: Referring Provider: Franchot Mimes Treating Provider/Extender: Kathreen Cosier in Treatment: 3 Information Obtained from: Patient Chief Complaint She is here for evaluation of bilateral lower extremity ulcers Electronic Signature(s) Signed: 06/03/2017 1:47:38 PM By: Bonnell Public Entered By: Bonnell Public on 06/03/2017 13:47:38 Lydia Buchanan ([id number]) -------------------------------------------------------------------------------- Debridement Details Patient Name: Lydia Buchanan Date of Service: 06/03/2017 1:15 PM Medical Record Number: [id number] Patient Account Number: [id number] Date of Birth/Sex: 04-26-1932 (84 y.o. F) Treating RN: Renne Crigler Primary Care Provider: PATIENT, NO Other Clinician: Referring Provider: Franchot Mimes Treating Provider/Extender: Kathreen Cosier in Treatment: 3 Debridement Performed for Wound #1 Right,Lateral Lower Leg Assessment: Performed By: Physician Bonnell Public, NP Debridement Type: Debridement Severity of Tissue Pre Fat layer exposed Debridement: Pre-procedure Verification/Time Yes - 13:27 Out Taken: Start Time: 13:28 Pain Control: Other : lidocaine 4% Total Area Debrided (L x W): 0.7 (cm) x 0.6 (cm) = 0.42 (cm) Tissue and other material Viable, Non-Viable, Slough, Subcutaneous, Biofilm, Slough debrided: Level: Skin/Subcutaneous Tissue Debridement Description: Excisional Instrument: Curette Bleeding: Minimum Hemostasis Achieved: Pressure End Time: 13:29 Procedural Pain: 0 Post Procedural Pain: 0 Response to Treatment: Procedure was tolerated well Level of  Consciousness: Awake and Alert Post Procedure Vitals: Temperature: 98.1 Pulse: 88 Respiratory Rate: 18 Blood Pressure: Systolic Blood Pressure: 126 Diastolic Blood Pressure: 84 Post Debridement Measurements of Total Wound Length: (cm) 0.7 Width: (cm) 0.6 Depth: (cm) 0.2 Volume: (cm) 0.066 Character of Wound/Ulcer Post Debridement: Stable Severity of Tissue Post Debridement: Fat layer exposed Post Procedure Diagnosis Same as Pre-procedure Electronic Signature(s) Signed: 06/03/2017 4:33:19 PM By: Renne Crigler Signed: 06/04/2017 8:17:02 AM By: Alver Buchanan, Lydia ([id number]) Entered By: Renne Crigler on 06/03/2017 13:30:40 Lydia Buchanan ([id number]) -------------------------------------------------------------------------------- Debridement Details Patient Name: Lydia Buchanan Date of Service: 06/03/2017 1:15 PM Medical Record Number: [id number] Patient Account Number: [id number] Date of Birth/Sex: 26-Dec-1932 (84 y.o. F) Treating RN: Renne Crigler Primary Care Provider: PATIENT, NO Other Clinician: Referring Provider: Franchot Mimes Treating Provider/Extender: Kathreen Cosier in Treatment: 3 Debridement Performed for Wound #2 Right,Posterior Lower Leg Assessment: Performed By: Physician Bonnell Public, NP Debridement Type: Debridement Severity of Tissue Pre Fat layer exposed Debridement: Pre-procedure Verification/Time Yes - 13:27 Out Taken: Start Time: 13:28 Pain Control: Other : lidocaine 4% Total Area Debrided (L x W): 1.6 (cm) x 1.2 (cm) = 1.92 (cm) Tissue and other material Viable, Non-Viable, Slough, Subcutaneous, Biofilm, Slough debrided: Level: Skin/Subcutaneous Tissue Debridement Description: Excisional Instrument: Curette Bleeding: Minimum Hemostasis Achieved: Pressure End Time: 13:29 Procedural Pain: 0 Post Procedural Pain: 0 Response to Treatment: Procedure was tolerated well Level of Consciousness: Awake and Alert Post  Procedure Vitals: Temperature: 98.1 Pulse: 88 Respiratory Rate: 18 Blood Pressure: Systolic Blood Pressure: 126 Diastolic Blood Pressure: 84 Post Debridement Measurements of Total Wound Length: (cm) 1.6 Width: (cm) 1.2 Depth: (cm) 0.2 Volume: (cm) 0.302 Character of Wound/Ulcer Post Debridement: Stable Severity of Tissue Post Debridement: Fat layer exposed Post Procedure Diagnosis Same as Pre-procedure Electronic Signature(s) Signed: 06/03/2017 1:47:30 PM By: Bonnell Public Signed: 06/03/2017 4:33:19 PM By: Garald Buchanan, Lydia ([id number]) Entered By: Bonnell Public on 06/03/2017 13:47:30 Lydia Buchanan ([id number]) -------------------------------------------------------------------------------- HPI Details Patient Name: Lydia Buchanan Date of Service: 06/03/2017 1:15 PM Medical Record Number: [id number] Patient Account Number: [id number] Date  of Birth/Sex: 1932/09/28 (84 y.o. F) Treating RN: Renne Crigler Primary Care Provider: PATIENT, NO Other Clinician: Referring Provider: Franchot Mimes Treating Provider/Extender: Kathreen Cosier in Treatment: 3 History of Present Illness HPI Description: 05/13/17-She is here for initial evaluation of bilateral location big ulcers. She recently moved here from [place], resides at [facility]. She states that approximately 6-8 weeks ago she developed bilateral lower extremity edema with blisters which resulted in ulcerations. She has been going to [place] [facility] and was being treated with Xeroform and thigh-high TED hose compression. She was on Eliquis for atrial fibrillation, but that was DC'd approximately 3 weeks ago by cardiology when she was hospitalized after uncontrolled bleeding from the left lower extremity ulcer. She is extremely anxious, nervous and would prefer no debridement at today's appointment; repeating questions. We will initiate home health. She is non-diabetic and a former smoker, quit   10 years ago. According to Epic records, she has been to the emergency department twice since arrival to [facility]; visit on 4/28 for BLE pain a DVT study was done and negative on 4/30 visit her daughter requested a psych evaluation r/t manipulative behavior and extreme anxiety. 05/20/17 on evaluation today patient's wounds actually show signs of being a little bit better compared to last week's evaluation that I was not the provider see her at that point. She does have some Slough noted on the surface of the wound beds but fortunately there does not appear to be any evidence of significant infection. No fevers, chills, nausea, or vomiting noted at this time. Patient does appear to be extremely anxious. 05/27/17-She is here in follow-up evaluation for bilateral lower extremity ulcers. She is extremely anxious. It is noted that last week there was bleeding complications with the left lower extremity ulcer, there is residual chemical cautery effect. We will defer debridement, overall there is improvement to these ulcers. We will continue with Iodosorb/Iodoflex and 3 layer compression wrap. 06/03/17-She is here in follow up evaluation for bilateral lower extremity ulcers. Home health was then able to evaluate on Monday; she was seen in the emergency room for anxiety/panic attack and taken home Tuesday evening. There has been improvement in ulceration since last evaluation and we will continue with the same treatment plan she will follow-up next week. Electronic Signature(s) Signed: 06/03/2017 1:48:21 PM By: Bonnell Public Entered By: Bonnell Public on 06/03/2017 13:48:21 Lydia Buchanan ([id number]) -------------------------------------------------------------------------------- Physician Orders Details Patient Name: Lydia Buchanan Date of Service: 06/03/2017 1:15 PM Medical Record Number: [id number] Patient Account Number: [id number] Date of Birth/Sex: 03-26-1932 (84 y.o. F) Treating RN: Renne Crigler Primary Care Provider: PATIENT, NO Other Clinician: Referring Provider: Franchot Mimes Treating Provider/Extender: Kathreen Cosier in Treatment: 3 Verbal / Phone Orders: No Diagnosis Coding Wound Cleansing Wound #1 Right,Lateral Lower Leg o Clean wound with Normal Saline. o Cleanse wound with mild soap and water Wound #2 Right,Posterior Lower Leg o Clean wound with Normal Saline. o Cleanse wound with mild soap and water Wound #3 Left,Anterior Lower Leg o Clean wound with Normal Saline. o Cleanse wound with mild soap and water Anesthetic (add to Medication List) Wound #1 Right,Lateral Lower Leg o Topical Lidocaine 4% cream applied to wound bed prior to debridement (In Clinic Only). Wound #2 Right,Posterior Lower Leg o Topical Lidocaine 4% cream applied to wound bed prior to debridement (In Clinic Only). Wound #3 Left,Anterior Lower Leg o Topical Lidocaine 4% cream applied to wound bed prior to debridement (In Clinic Only). Primary Wound Dressing Wound #1  Right,Lateral Lower Leg o Iodoflex Wound #2 Right,Posterior Lower Leg o Iodoflex Wound #3 Left,Anterior Lower Leg o Iodoflex - Please be careful with this wound and do not scrub this wound!!! Secondary Dressing Wound #1 Right,Lateral Lower Leg o ABD pad Wound #2 Right,Posterior Lower Leg o ABD pad Wound #3 Left,Anterior Lower Leg o ABD pad Dressing Change Frequency Buchanan, Lydia ([id number]) Wound #1 Right,Lateral Lower Leg o Dressing is to be changed Monday and Thursday. - HHRN to change dressings on Mondays and pt will be seen in Wound Care Clinic on Thursdays Wound #2 Right,Posterior Lower Leg o Dressing is to be changed Monday and Thursday. - HHRN to change dressings on Mondays and pt will be seen in Wound Care Clinic on Thursdays Wound #3 Left,Anterior Lower Leg o Dressing is to be changed Monday and Thursday. - HHRN to change dressings on Mondays and pt will be seen  in Wound Care Clinic on Thursdays Follow-up Appointments Wound #1 Right,Lateral Lower Leg o Return Appointment in 1 week. Wound #2 Right,Posterior Lower Leg o Return Appointment in 1 week. Wound #3 Left,Anterior Lower Leg o Return Appointment in 1 week. Edema Control Wound #1 Right,Lateral Lower Leg o 3 Layer Compression System - Bilateral o Elevate legs to the level of the heart and pump ankles as often as possible Wound #2 Right,Posterior Lower Leg o 3 Layer Compression System - Bilateral o Elevate legs to the level of the heart and pump ankles as often as possible Wound #3 Left,Anterior Lower Leg o 3 Layer Compression System - Bilateral o Elevate legs to the level of the heart and pump ankles as often as possible Additional Orders / Instructions Wound #1 Right,Lateral Lower Leg o Increase protein intake. Wound #2 Right,Posterior Lower Leg o Increase protein intake. Wound #3 Left,Anterior Lower Leg o Increase protein intake. Home Health Wound #1 Right,Lateral Lower Leg o Continue Home Health Visits - no unna boots use a 3 - layer o Home Health Nurse may visit PRN to address patientos wound care needs. o FACE TO FACE ENCOUNTER: MEDICARE and MEDICAID PATIENTS: I certify that this patient is under my care and that I had a face-to-face encounter that meets the physician face-to-face encounter requirements with this patient on this date. The encounter with the patient was in whole or in part for the following MEDICAL CONDITION: (primary reason for Home Healthcare) MEDICAL NECESSITY: I certify, that based on my findings, NURSING services are a medically necessary home health service. HOME BOUND STATUS: I certify that my clinical findings support that this patient is homebound (i.e., Due to illness or injury, pt requires aid of supportive devices such as crutches, cane, wheelchairs, walkers, the use of special transportation or the [facility], [place]  ([id number]) assistance of another person to leave their place of residence. There is a normal inability to leave the home and doing so requires considerable and taxing effort. Other absences are for medical reasons / religious services and are infrequent or of short duration when for other reasons). o If current dressing causes regression in wound condition, may D/C ordered dressing product/s and apply Normal Saline Moist Dressing daily until next Wound Healing Center / Other MD appointment. Notify Wound Healing Center of regression in wound condition at [phone]. o Please direct any NON-WOUND related issues/requests for orders to patient's Primary Care Physician Wound #2 Right,Posterior Lower Leg o Continue Home Health Visits - no unna boots use a 3 - layer o Home Health Nurse may visit PRN to address patientos wound  care needs. o FACE TO FACE ENCOUNTER: MEDICARE and MEDICAID PATIENTS: I certify that this patient is under my care and that I had a face-to-face encounter that meets the physician face-to-face encounter requirements with this patient on this date. The encounter with the patient was in whole or in part for the following MEDICAL CONDITION: (primary reason for Home Healthcare) MEDICAL NECESSITY: I certify, that based on my findings, NURSING services are a medically necessary home health service. HOME BOUND STATUS: I certify that my clinical findings support that this patient is homebound (i.e., Due to illness or injury, pt requires aid of supportive devices such as crutches, cane, wheelchairs, walkers, the use of special transportation or the assistance of another person to leave their place of residence. There is a normal inability to leave the home and doing so requires considerable and taxing effort. Other absences are for medical reasons / religious services and are infrequent or of short duration when for other reasons). o If current dressing causes regression in  wound condition, may D/C ordered dressing product/s and apply Normal Saline Moist Dressing daily until next Wound Healing Center / Other MD appointment. Notify Wound Healing Center of regression in wound condition at [phone]. o Please direct any NON-WOUND related issues/requests for orders to patient's Primary Care Physician Wound #3 Left,Anterior Lower Leg o Continue Home Health Visits - no unna boots use a 3 - layer o Home Health Nurse may visit PRN to address patientos wound care needs. o FACE TO FACE ENCOUNTER: MEDICARE and MEDICAID PATIENTS: I certify that this patient is under my care and that I had a face-to-face encounter that meets the physician face-to-face encounter requirements with this patient on this date. The encounter with the patient was in whole or in part for the following MEDICAL CONDITION: (primary reason for Home Healthcare) MEDICAL NECESSITY: I certify, that based on my findings, NURSING services are a medically necessary home health service. HOME BOUND STATUS: I certify that my clinical findings support that this patient is homebound (i.e., Due to illness or injury, pt requires aid of supportive devices such as crutches, cane, wheelchairs, walkers, the use of special transportation or the assistance of another person to leave their place of residence. There is a normal inability to leave the home and doing so requires considerable and taxing effort. Other absences are for medical reasons / religious services and are infrequent or of short duration when for other reasons). o If current dressing causes regression in wound condition, may D/C ordered dressing product/s and apply Normal Saline Moist Dressing daily until next Wound Healing Center / Other MD appointment. Notify Wound Healing Center of regression in wound condition at [phone]. o Please direct any NON-WOUND related issues/requests for orders to patient's Primary Care Physician Electronic  Signature(s) Signed: 06/03/2017 4:33:19 PM By: Renne Crigler Signed: 06/04/2017 8:17:02 AM By: Bonnell Public Entered By: Renne Crigler on 06/03/2017 13:27:49 Lydia Buchanan ([id number]) -------------------------------------------------------------------------------- Problem List Details Patient Name: Lydia Buchanan Date of Service: 06/03/2017 1:15 PM Medical Record Number: [id number] Patient Account Number: [id number] Date of Birth/Sex: [birth year] (84 y.o. 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 F) Treating RN: Renne Crigler Primary Care Provider: PATIENT, NO Other Clinician: Referring Provider: Franchot Mimes Treating Provider/Extender: Kathreen Cosier in Treatment: 3 Subjective Chief Complaint Information obtained from Patient She is here for evaluation of bilateral lower extremity ulcers History of Present Illness (HPI) 05/13/17-She is here for initial evaluation of bilateral location big ulcers. She recently moved  here from [place], resides at [facility]. She states that approximately 6-8 weeks ago she developed bilateral lower extremity edema with blisters which resulted in ulcerations. She has been going to [place] wound [facility] and was being treated with Xeroform and thigh-high TED hose compression. She was on Eliquis for atrial fibrillation, but that was DC'd approximately 3 weeks ago by cardiology when she was hospitalized after uncontrolled bleeding from the left lower extremity ulcer. She is extremely anxious, nervous and would prefer no debridement at today's appointment; repeating questions. We will initiate home health. She is non-diabetic and a former smoker, quit  10 years ago. According to Epic records, she has been to the emergency department twice since arrival to [facility]; visit on 4/28 for BLE pain a DVT study was done and negative on 4/30 visit her daughter requested a psych evaluation r/t manipulative behavior and extreme anxiety. 05/20/17 on evaluation today patient's wounds actually show signs of being a little bit better compared to last week's evaluation that I was not the provider see her at that point. She does have some Slough noted on the surface of the wound beds but fortunately there does not appear to be any evidence of significant infection. No fevers, chills, nausea, or vomiting noted at this time. Patient does appear to be extremely anxious. 05/27/17-She is here in follow-up evaluation for bilateral lower extremity ulcers. She is extremely anxious. It is noted that last week there was bleeding complications with the left lower extremity ulcer, there is residual chemical cautery effect. We will defer debridement, overall there is improvement to these ulcers. We will continue with Iodosorb/Iodoflex and 3 layer compression wrap. 06/03/17-She is here in follow up evaluation for bilateral lower extremity ulcers. Home health was then able to evaluate on Monday; she was  seen in the emergency room for anxiety/panic attack and taken home Tuesday evening. There has been improvement in ulceration since last evaluation and we will continue with the same treatment plan she will follow-up next week. Patient History Information obtained from Patient. Family History Cancer - Child, Heart Disease - Mother,Father, Hypertension - Mother,Father, No family history of Diabetes, Hereditary Spherocytosis, Kidney Disease, Lung Disease, Seizures, Stroke, Thyroid Problems, Tuberculosis. Social History Former smoker - 10 years, Marital Status - Widowed, Alcohol Use - Never, Drug Use - No History, Caffeine Use - Daily. Medical And Surgical History Notes Neurologic TIA Buchanan, Lydia ([id number]) Objective Constitutional Vitals Time Taken: 1:05 PM, Height: 62 in, Weight: 112 lbs, BMI: 20.5, Temperature: 98.1 F, Pulse: 88 bpm, Respiratory Rate: 18 breaths/min, Blood Pressure: 126/84 mmHg. Integumentary (Hair, Skin) Wound #1 status is Open. Original cause of wound was Gradually Appeared. The wound is located on the Right,Lateral Lower Leg. The wound measures 0.7cm length x 0.6cm width x 0.2cm depth; 0.33cm^2 area and 0.066cm^3 volume. There is Fat Layer (Subcutaneous Tissue)  Exposed exposed. There is no tunneling or undermining noted. There is a small amount of serous drainage noted. The wound margin is flat and intact. There is medium (34-66%) pink granulation within the wound bed. There is a medium (34-66%) amount of necrotic tissue within the wound bed including Adherent Slough. The periwound skin appearance exhibited: Hemosiderin Staining. The periwound skin appearance did not exhibit: Callus, Crepitus, Excoriation, Induration, Rash, Scarring, Dry/Scaly, Maceration, Atrophie Blanche, Cyanosis, Ecchymosis, Mottled, Pallor, Rubor, Erythema. Periwound temperature was noted as No Abnormality. The periwound has tenderness on palpation. Wound #2 status is Open. Original cause  of wound was Gradually Appeared. The wound is located on the Right,Posterior Lower Leg. The wound measures 1.6cm length x 1.2cm width x 0.2cm depth; 1.508cm^2 area and 0.302cm^3 volume. There is Fat Layer (Subcutaneous Tissue) Exposed exposed. There is no tunneling or undermining noted. There is a large amount of serous drainage noted. The wound margin is flat and intact. There is medium (34-66%) pink granulation within the wound bed. There is a medium (34-66%) amount of necrotic tissue within the wound bed including Adherent Slough. The periwound skin appearance exhibited: Hemosiderin Staining. The periwound skin appearance did not exhibit: Callus, Crepitus, Excoriation, Induration, Rash, Scarring, Dry/Scaly, Maceration, Atrophie Blanche, Cyanosis, Ecchymosis, Mottled, Pallor, Rubor, Erythema. Periwound temperature was noted as No Abnormality. The periwound has tenderness on palpation. Wound #3 status is Open. Original cause of wound was Gradually Appeared. The wound is located on the Left,Anterior Lower Leg. The wound measures 0.6cm length x 0.7cm width x 0.2cm depth; 0.33cm^2 area and 0.066cm^3 volume. There is Fat Layer (Subcutaneous Tissue) Exposed exposed. There is no tunneling or undermining noted. There is a large amount of serous drainage noted. The wound margin is flat and intact. There is medium (34-66%) red granulation within the wound bed. There is a medium (34-66%) amount of necrotic tissue within the wound bed including Adherent Slough. The periwound skin appearance exhibited: Hemosiderin Staining. The periwound skin appearance did not exhibit: Callus, Crepitus, Excoriation, Induration, Rash, Scarring, Dry/Scaly, Maceration, Atrophie Blanche, Cyanosis, Ecchymosis, Mottled, Pallor, Rubor, Erythema. Periwound temperature was noted as No Abnormality. The periwound has tenderness on palpation. Assessment Active Problems ICD-10 I83.893 - Varicose veins of bilateral lower extremities with  other complications L97.212 - Non-pressure chronic ulcer of right calf with fat layer exposed L97.222 - Non-pressure chronic ulcer of left calf with fat layer exposed Buchanan, Lydia ([id number]) Procedures Wound #1 Pre-procedure diagnosis of Wound #1 is a Venous Leg Ulcer located on the Right,Lateral Lower Leg .Severity of Tissue Pre Debridement is: Fat layer exposed. There was a Excisional Skin/Subcutaneous Tissue Debridement with a total area of 0.42 sq cm performed by Bonnell Public, NP. With the following instrument(s): Curette to remove Viable and Non-Viable tissue/material. Material removed includes Subcutaneous Tissue, Slough, and Biofilm after achieving pain control using Other (lidocaine 4%). No specimens were taken. A time out was conducted at 13:27, prior to the start of the procedure. A Minimum amount of bleeding was controlled with Pressure. The procedure was tolerated well with a pain level of 0 throughout and a pain level of 0 following the procedure. Patient s Level of Consciousness post procedure was recorded as Awake and Alert and post-procedure vitals were taken including Temperature: 98.1 F, Pulse: 88 bpm, Respiratory Rate: 18 breaths/min, Blood Pressure: (126)/(84) mmHg. Post Debridement Measurements: 0.7cm length x 0.6cm width x 0.2cm depth; 0.066cm^3 volume. Character of Wound/Ulcer Post Debridement is stable. Severity of Tissue Post Debridement is: Fat layer exposed. Post procedure Diagnosis  Wound #1: Same as Pre-Procedure Wound #2 Pre-procedure diagnosis of Wound #2 is a Venous Leg Ulcer located on the Right,Posterior Lower Leg .Severity of Tissue Pre Debridement is: Fat layer exposed. There was a Excisional Skin/Subcutaneous Tissue Debridement with a total area of 1.92 sq cm performed by Bonnell Public, NP. With the following instrument(s): Curette to remove Viable and Non-Viable tissue/material. Material removed includes Subcutaneous Tissue, Slough, and Biofilm after  achieving pain control using Other (lidocaine 4%). No specimens were taken. A time out was conducted at 13:27, prior to the start of the procedure. A Minimum amount of bleeding was controlled with Pressure. The procedure was tolerated well with a pain level of 0 throughout and a pain level of 0 following the procedure. Patient s Level of Consciousness post procedure was recorded as Awake and Alert and post-procedure vitals were taken including Temperature: 98.1 F, Pulse: 88 bpm, Respiratory Rate: 18 breaths/min, Blood Pressure: (126)/(84) mmHg. Post Debridement Measurements: 1.6cm length x 1.2cm width x 0.2cm depth; 0.302cm^3 volume. Character of Wound/Ulcer Post Debridement is stable. Severity of Tissue Post Debridement is: Fat layer exposed. Post procedure Diagnosis Wound #2: Same as Pre-Procedure Plan Wound Cleansing: Wound #1 Right,Lateral Lower Leg: Clean wound with Normal Saline. Cleanse wound with mild soap and water Wound #2 Right,Posterior Lower Leg: Clean wound with Normal Saline. Cleanse wound with mild soap and water Wound #3 Left,Anterior Lower Leg: Clean wound with Normal Saline. Cleanse wound with mild soap and water Anesthetic (add to Medication List): Wound #1 Right,Lateral Lower Leg: Topical Lidocaine 4% cream applied to wound bed prior to debridement (In Clinic Only). Wound #2 Right,Posterior Lower Leg: Topical Lidocaine 4% cream applied to wound bed prior to debridement (In Clinic Only). Wound #3 Left,Anterior Lower Leg: Topical Lidocaine 4% cream applied to wound bed prior to debridement (In Clinic Only). Lydia Buchanan, Lydia Buchanan ([id number]) Primary Wound Dressing: Wound #1 Right,Lateral Lower Leg: Iodoflex Wound #2 Right,Posterior Lower Leg: Iodoflex Wound #3 Left,Anterior Lower Leg: Iodoflex - Please be careful with this wound and do not scrub this wound!!! Secondary Dressing: Wound #1 Right,Lateral Lower Leg: ABD pad Wound #2 Right,Posterior Lower Leg: ABD  pad Wound #3 Left,Anterior Lower Leg: ABD pad Dressing Change Frequency: Wound #1 Right,Lateral Lower Leg: Dressing is to be changed Monday and Thursday. - HHRN to change dressings on Mondays and pt will be seen in Wound Care Clinic on Thursdays Wound #2 Right,Posterior Lower Leg: Dressing is to be changed Monday and Thursday. - HHRN to change dressings on Mondays and pt will be seen in Wound Care Clinic on Thursdays Wound #3 Left,Anterior Lower Leg: Dressing is to be changed Monday and Thursday. - HHRN to change dressings on Mondays and pt will be seen in Wound Care Clinic on Thursdays Follow-up Appointments: Wound #1 Right,Lateral Lower Leg: Return Appointment in 1 week. Wound #2 Right,Posterior Lower Leg: Return Appointment in 1 week. Wound #3 Left,Anterior Lower Leg: Return Appointment in 1 week. Edema Control: Wound #1 Right,Lateral Lower Leg: 3 Layer Compression System - Bilateral Elevate legs to the level of the heart and pump ankles as often as possible Wound #2 Right,Posterior Lower Leg: 3 Layer Compression System - Bilateral Elevate legs to the level of the heart and pump ankles as often as possible Wound #3 Left,Anterior Lower Leg: 3 Layer Compression System - Bilateral Elevate legs to the level of the heart and pump ankles as often as possible Additional Orders / Instructions: Wound #1 Right,Lateral Lower Leg: Increase protein intake. Wound #2 Right,Posterior Lower Leg:  Increase protein intake. Wound #3 Left,Anterior Lower Leg: Increase protein intake. Home Health: Wound #1 Right,Lateral Lower Leg: Continue Home Health Visits - no unna boots use a 3 - layer Home Health Nurse may visit PRN to address patient s wound care needs. FACE TO FACE ENCOUNTER: MEDICARE and MEDICAID PATIENTS: I certify that this patient is under my care and that I had a face-to-face encounter that meets the physician face-to-face encounter requirements with this patient on this date.  The encounter with the patient was in whole or in part for the following MEDICAL CONDITION: (primary reason for Home Healthcare) MEDICAL NECESSITY: I certify, that based on my findings, NURSING services are a medically necessary home health service. HOME BOUND STATUS: I certify that my clinical findings support that this patient is homebound (i.e., Due to illness or injury, pt requires aid of supportive devices such as crutches, cane, wheelchairs, walkers, the use of special transportation or the assistance of another person to leave their place of residence. There is a normal inability to leave the home and doing so requires considerable and taxing effort. Other absences are for medical reasons / religious services and [place], [place] ([id number]) are infrequent or of short duration when for other reasons). If current dressing causes regression in wound condition, may D/C ordered dressing product/s and apply Normal Saline Moist Dressing daily until next Wound Healing Center / Other MD appointment. Notify Wound Healing Center of regression in wound condition at [phone]. Please direct any NON-WOUND related issues/requests for orders to patient's Primary Care Physician Wound #2 Right,Posterior Lower Leg: Continue Home Health Visits - no unna boots use a 3 - layer Home Health Nurse may visit PRN to address patient s wound care needs. FACE TO FACE ENCOUNTER: MEDICARE and MEDICAID PATIENTS: I certify that this patient is under my care and that I had a face-to-face encounter that meets the physician face-to-face encounter requirements with this patient on this date. The encounter with the patient was in whole or in part for the following MEDICAL CONDITION: (primary reason for Home Healthcare) MEDICAL NECESSITY: I certify, that based on my findings, NURSING services are a medically necessary home health service. HOME BOUND STATUS: I certify that my clinical findings support that this patient is  homebound (i.e., Due to illness or injury, pt requires aid of supportive devices such as crutches, cane, wheelchairs, walkers, the use of special transportation or the assistance of another person to leave their place of residence. There is a normal inability to leave the home and doing so requires considerable and taxing effort. Other absences are for medical reasons / religious services and are infrequent or of short duration when for other reasons). If current dressing causes regression in wound condition, may D/C ordered dressing product/s and apply Normal Saline Moist Dressing daily until next Wound Healing Center / Other MD appointment. Notify Wound Healing Center of regression in wound condition at [phone]. Please direct any NON-WOUND related issues/requests for orders to patient's Primary Care Physician Wound #3 Left,Anterior Lower Leg: Continue Home Health Visits - no unna boots use a 3 - layer Home Health Nurse may visit PRN to address patient s wound care needs. FACE TO FACE ENCOUNTER: MEDICARE and MEDICAID PATIENTS: I certify that this patient is under my care and that I had a face-to-face encounter that meets the physician face-to-face encounter requirements with this patient on this date. The encounter with the patient was in whole or in part for the following MEDICAL CONDITION: (primary reason for Home  Healthcare) MEDICAL NECESSITY: I certify, that based on my findings, NURSING services are a medically necessary home health service. HOME BOUND STATUS: I certify that my clinical findings support that this patient is homebound (i.e., Due to illness or injury, pt requires aid of supportive devices such as crutches, cane, wheelchairs, walkers, the use of special transportation or the assistance of another person to leave their place of residence. There is a normal inability to leave the home and doing so requires considerable and taxing effort. Other absences are for medical  reasons / religious services and are infrequent or of short duration when for other reasons). If current dressing causes regression in wound condition, may D/C ordered dressing product/s and apply Normal Saline Moist Dressing daily until next Wound Healing Center / Other MD appointment. Notify Wound Healing Center of regression in wound condition at [phone]. Please direct any NON-WOUND related issues/requests for orders to patient's Primary Care Physician Electronic Signature(s) Signed: 06/03/2017 1:48:41 PM By: Bonnell Public Entered By: Bonnell Public on 06/03/2017 13:48:41 Lydia Buchanan ([id number]) -------------------------------------------------------------------------------- ROS/PFSH Details Patient Name: Lydia Buchanan Date of Service: 06/03/2017 1:15 PM Medical Record Number: [id number] Patient Account Number: [id number] Date of Birth/Sex: 25-Jun-1932 (84 y.o. F) Treating RN: Renne Crigler Primary Care Provider: PATIENT, NO Other Clinician: Referring Provider: Franchot Mimes Treating Provider/Extender: Kathreen Cosier in Treatment: 3 Information Obtained From Patient Wound History Do you currently have one or more open woundso Yes How many open wounds do you currently haveo 2 Approximately how long have you had your woundso 6 weeks How have you been treating your wound(s) until nowo unknown Has your wound(s) ever healed and then re-openedo No Have you had any lab work done in the past montho No Have you tested positive for an antibiotic resistant organism (MRSA, VRE)o No Have you tested positive for osteomyelitis (bone infection)o No Have you had any tests for circulation on your legso No Eyes Medical History: Positive for: Cataracts - removed Negative for: Glaucoma; Optic Neuritis Ear/Nose/Mouth/Throat Medical History: Negative for: Chronic sinus problems/congestion; Middle ear problems Hematologic/Lymphatic Medical History: Negative for: Anemia;  Hemophilia; Human Immunodeficiency Virus; Lymphedema; Sickle Cell Disease Respiratory Medical History: Negative for: Aspiration; Asthma; Chronic Obstructive Pulmonary Disease (COPD); Pneumothorax; Sleep Apnea; Tuberculosis Cardiovascular Medical History: Positive for: Arrhythmia - a fib; Congestive Heart Failure; Hypertension Negative for: Angina; Coronary Artery Disease; Deep Vein Thrombosis; Hypotension; Myocardial Infarction; Peripheral Arterial Disease; Peripheral Venous Disease; Phlebitis; Vasculitis Gastrointestinal Medical History: Negative for: Cirrhosis ; Colitis; Crohnos; Hepatitis A; Hepatitis B; Hepatitis C Endocrine Lydia Buchanan, Lydia Buchanan ([id number]) Medical History: Negative for: Type I Diabetes; Type II Diabetes Genitourinary Medical History: Negative for: End Stage Renal Disease Immunological Medical History: Negative for: Lupus Erythematosus; Raynaudos; Scleroderma Integumentary (Skin) Medical History: Negative for: History of Burn; History of pressure wounds Musculoskeletal Medical History: Negative for: Gout; Rheumatoid Arthritis; Osteoarthritis; Osteomyelitis Neurologic Medical History: Negative for: Dementia; Neuropathy; Quadriplegia; Paraplegia; Seizure Disorder Past Medical History Notes: TIA Oncologic Medical History: Negative for: Received Chemotherapy; Received Radiation Psychiatric Medical History: Negative for: Anorexia/bulimia; Confinement Anxiety HBO Extended History Items Eyes: Cataracts Immunizations Pneumococcal Vaccine: Received Pneumococcal Vaccination: Yes Implantable Devices Family and Social History Cancer: Yes - Child; Diabetes: No; Heart Disease: Yes - Mother,Father; Hereditary Spherocytosis: No; Hypertension: Yes - Mother,Father; Kidney Disease: No; Lung Disease: No; Seizures: No; Stroke: No; Thyroid Problems: No; Tuberculosis: No; Former smoker - 10 years; Marital Status - Widowed; Alcohol Use: Never; Drug Use: No History; Caffeine  Use: Daily; Financial Concerns: No; Food, Clothing or Shelter Needs: No;  Support System Lacking: No; Transportation Concerns: No; Advanced Directives: No; Patient does not want information on Advanced Directives Physician Affirmation I have reviewed and agree with the above information. Lydia Buchanan, Lydia Buchanan ([id number]) Electronic Signature(s) Signed: 06/03/2017 4:33:19 PM By: Renne Crigler Signed: 06/04/2017 8:17:02 AM By: Bonnell Public Entered By: Bonnell Public on 06/03/2017 13:48:29 Lydia Buchanan ([id number]) -------------------------------------------------------------------------------- SuperBill Details Patient Name: Lydia Buchanan Date of Service: 06/03/2017 Medical Record Number: [id number] Patient Account Number: [id number] Date of Birth/Sex: [birth year] (84 y.o. 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 layer exposed Facility Procedures CPT4 Code: 41324401 Description: 11042 - DEB SUBQ TISSUE 20 SQ CM/< ICD-10 Diagnosis Description L97.212 Non-pressure chronic ulcer of right calf with fat layer exp Modifier: osed Quantity: 1 Physician Procedures CPT4 Code: [id number] Description: 11042 - WC PHYS SUBQ TISS 20 SQ CM ICD-10 Diagnosis Description L97.212 Non-pressure chronic ulcer of right calf with fat layer exp Modifier: osed Quantity: 1 Electronic Signature(s) Signed: 06/03/2017 1:49:08 PM By: Bonnell Public Previous Signature: 06/03/2017 1:48:53 PM Version By: Bonnell Public Entered By: Bonnell Public on 06/03/2017 13:49:08

## 2017-06-05 NOTE — Progress Notes (Signed)
UVA, RUNKEL ([id number]) Visit Report for 06/03/2017 Arrival Information Details Patient Name: Lydia Buchanan, Lydia Buchanan Date of Service: 06/03/2017 1:15 PM Medical Record Number: [id number] Patient Account Number: [id number] Date of Birth/Sex: 05/14/1932 (84 y.o. F) Treating RN: Curtis Sites Primary Care Lucella Pommier: PATIENT, NO Other Clinician: Referring Zia Najera: Franchot Mimes Treating Emoni Whitworth/Extender: Kathreen Cosier in Treatment: 3 Visit Information History Since Last Visit Added or deleted any medications: No Patient Arrived: Walker Any new allergies or adverse reactions: No Arrival Time: 13:03 Had a fall or experienced change in No Accompanied By: son activities of daily living that may affect Transfer Assistance: None risk of falls: Patient Identification Verified: Yes Signs or symptoms of abuse/neglect since last visito No Secondary Verification Process Completed: Yes Hospitalized since last visit: No Implantable device outside of the clinic excluding No cellular tissue based products placed in the center since last visit: Has Dressing in Place as Prescribed: Yes Has Compression in Place as Prescribed: Yes Pain Present Now: No Electronic Signature(s) Signed: 06/03/2017 5:31:29 PM By: Curtis Sites Entered By: Curtis Sites on 06/03/2017 13:05:09 Lydia Buchanan ([id number]) -------------------------------------------------------------------------------- Encounter Discharge Information Details Patient Name: Lydia Buchanan Date of Service: 06/03/2017 1:15 PM Medical Record Number: [id number] Patient Account Number: [id number] Date of Birth/Sex: 10-26-1932 (84 y.o. F) Treating RN: Huel Coventry Primary Care Leonidas Boateng: PATIENT, NO Other Clinician: Referring Tavaras Goody: Franchot Mimes Treating Mansi Tokar/Extender: Kathreen Cosier in Treatment: 3 Encounter Discharge Information Items Discharge Condition: Stable Ambulatory Status: Walker Discharge Destination:  Home Transportation: Private Auto Accompanied By: son Schedule Follow-up Appointment: Yes Clinical Summary of Care: Electronic Signature(s) Signed: 06/03/2017 4:51:02 PM By: Elliot Gurney, BSN, RN, CWS, Kim RN, BSN Entered By: Elliot Gurney, BSN, RN, CWS, Kim on 06/03/2017 16:51:02 Lydia Buchanan ([id number]) -------------------------------------------------------------------------------- Lower Extremity Assessment Details Patient Name: Lydia Buchanan Date of Service: 06/03/2017 1:15 PM Medical Record Number: [id number] Patient Account Number: [id number] Date of Birth/Sex: 01-Sep-1932 (84 y.o. F) Treating RN: Curtis Sites Primary Care Madellyn Denio: PATIENT, NO Other Clinician: Referring Johnathin Vanderschaaf: Franchot Mimes Treating Cheria Sadiq/Extender: Kathreen Cosier in Treatment: 3 Edema Assessment Assessed: [Left: No] [Right: No] E[Left: dema] [Right: :] Calf Left: Right: Point of Measurement: 32 cm From Medial Instep 31.7 cm 31.4 cm Ankle Left: Right: Point of Measurement: 10 cm From Medial Instep 20.5 cm 19.7 cm Vascular Assessment Pulses: Dorsalis Pedis Palpable: [Left:Yes] [Right:Yes] Posterior Tibial Extremity colors, hair growth, and conditions: Extremity Color: [Left:Hyperpigmented] [Right:Hyperpigmented] Hair Growth on Extremity: [Left:Yes] [Right:Yes] Temperature of Extremity: [Left:Warm] [Right:Warm] Capillary Refill: [Left:< 3 seconds] [Right:< 3 seconds] Toe Nail Assessment Left: Right: Thick: Yes Yes Discolored: No No Deformed: No No Improper Length and Hygiene: Yes Yes Electronic Signature(s) Signed: 06/03/2017 5:31:29 PM By: Curtis Sites Entered By: Curtis Sites on 06/03/2017 13:16:03 Lydia Buchanan ([id number]) -------------------------------------------------------------------------------- Multi Wound Chart Details Patient Name: Lydia Buchanan Date of Service: 06/03/2017 1:15 PM Medical Record Number: [id number] Patient Account Number: [id number] Date of Birth/Sex:  [birth year] (84 y.o. F) Treating RN: Renne Crigler Primary Care Kameran Lallier: PATIENT, NO Other Clinician: Referring Zanasia Hickson: Franchot Mimes Treating Teruko Joswick/Extender: Kathreen Cosier in Treatment: 3 Vital Signs Height(in): 62 Pulse(bpm): 88 Weight(lbs): 112 Blood Pressure(mmHg): 126/84 Body Mass Index(BMI): 20 Temperature(F): 98.1 Respiratory Rate 18 (breaths/min): Photos: [1:No Photos] [2:No Photos] [3:No Photos] Wound Location: [1:Right Lower Leg - Lateral] [2:Right Lower Leg - Posterior] [3:Left, Anterior Lower Leg] Wounding Event: [1:Gradually Appeared] [2:Gradually Appeared] [3:Gradually Appeared] Primary Etiology: [1:Venous Leg Ulcer] [2:Venous Leg Ulcer] [3:Venous Leg Ulcer] Comorbid History: [1:Cataracts, Arrhythmia, Congestive Heart Failure, Hypertension] [2:Cataracts, Arrhythmia, Congestive Heart Failure, Hypertension] [3:Cataracts, Arrhythmia, Congestive  Heart Failure, Hypertension] Date Acquired: [1:03/29/2017] [2:03/29/2017] [3:03/29/2017] Weeks of Treatment: [1:3] [2:3] [3:3] Wound Status: [1:Open] [2:Open] [3:Open] Measurements L x W x D [1:0.7x0.6x0.2] [2:1.6x1.2x0.2] [3:0.6x0.7x0.2] (cm) Area (cm) : [1:0.33] [2:1.508] [3:0.33] Volume (cm) : [1:0.066] [2:0.302] [3:0.066] % Reduction in Area: [1:12.50%] [2:58.30%] [3:80.50%] % Reduction in Volume: [1:-73.70%] [2:16.30%] [3:61.20%] Classification: [1:Full Thickness Without Exposed Support Structures] [2:Full Thickness Without Exposed Support Structures] [3:Full Thickness Without Exposed Support Structures] Exudate Amount: [1:Small] [2:Large] [3:Large] Exudate Type: [1:Serous] [2:Serous] [3:Serous] Exudate Color: [1:amber] [2:amber] [3:amber] Wound Margin: [1:Flat and Intact] [2:Flat and Intact] [3:Flat and Intact] Granulation Amount: [1:Medium (34-66%)] [2:Medium (34-66%)] [3:Medium (34-66%)] Granulation Quality: [1:Pink] [2:Pink] [3:Red] Necrotic Amount: [1:Medium (34-66%)] [2:Medium (34-66%)] [3:Medium  (34-66%)] Exposed Structures: [1:Fat Layer (Subcutaneous Tissue) Exposed: Yes Fascia: No Tendon: No Muscle: No Joint: No Bone: No] [2:Fat Layer (Subcutaneous Tissue) Exposed: Yes Fascia: No Tendon: No Muscle: No Joint: No Bone: No] [3:Fat Layer (Subcutaneous Tissue) Exposed: Yes  Fascia: No Tendon: No Muscle: No Joint: No Bone: No] Epithelialization: [1:None] [2:None] [3:None] Debridement: [1:Debridement - Excisional] [2:Debridement - Excisional] [3:N/A] Pre-procedure [1:13:27] [2:13:27] [3:N/A] Verification/Time Out Taken: Pain Control: [1:Other] [2:Other] [3:N/A] Tissue Debrided: Subcutaneous, Slough Subcutaneous, Slough N/A Level: Skin/Subcutaneous Tissue Skin/Subcutaneous Tissue N/A Debridement Area (sq cm): 0.42 1.92 N/A Instrument: Curette Curette N/A Bleeding: Minimum Minimum N/A Hemostasis Achieved: Pressure Pressure N/A Procedural Pain: 0 0 N/A Post Procedural Pain: 0 0 N/A Debridement Treatment Procedure was tolerated well Procedure was tolerated well N/A Response: Post Debridement 0.7x0.6x0.2 1.6x1.2x0.2 N/A Measurements L x W x D (cm) Post Debridement Volume: 0.066 0.302 N/A (cm) Periwound Skin Texture: Excoriation: No Excoriation: No Excoriation: No Induration: No Induration: No Induration: No Callus: No Callus: No Callus: No Crepitus: No Crepitus: No Crepitus: No Rash: No Rash: No Rash: No Scarring: No Scarring: No Scarring: No Periwound Skin Moisture: Maceration: No Maceration: No Maceration: No Dry/Scaly: No Dry/Scaly: No Dry/Scaly: No Periwound Skin Color: Hemosiderin Staining: Yes Hemosiderin Staining: Yes Hemosiderin Staining: Yes Atrophie Blanche: No Atrophie Blanche: No Atrophie Blanche: No Cyanosis: No Cyanosis: No Cyanosis: No Ecchymosis: No Ecchymosis: No Ecchymosis: No Erythema: No Erythema: No Erythema: No Mottled: No Mottled: No Mottled: No Pallor: No Pallor: No Pallor: No Rubor: No Rubor: No Rubor:  No Temperature: No Abnormality No Abnormality No Abnormality Tenderness on Palpation: Yes Yes Yes Wound Preparation: Ulcer Cleansing: Wound Ulcer Cleansing: Wound Ulcer Cleansing: Wound Cleanser Cleanser Cleanser Topical Anesthetic Applied: Topical Anesthetic Applied: Topical Anesthetic Applied: Other: lidocaine 4% Other: lidocaine 4% Other: lidocaine 4% Procedures Performed: Debridement Debridement N/A Treatment Notes Electronic Signature(s) Signed: 06/03/2017 1:47:14 PM By: Bonnell Public Entered By: Bonnell Public on 06/03/2017 13:47:14 Lydia Buchanan ([id number]) -------------------------------------------------------------------------------- Multi-Disciplinary Care Plan Details Patient Name: Lydia Buchanan Date of Service: 06/03/2017 1:15 PM Medical Record Number: [id number] Patient Account Number: [id number] Date of Birth/Sex: 03-27-1932 (84 y.o. F) Treating RN: Renne Crigler Primary Care Hinata Diener: PATIENT, NO Other Clinician: Referring Inga Noller: Franchot Mimes Treating Melinna Linarez/Extender: Kathreen Cosier in Treatment: 3 Active Inactive ` Abuse / Safety / Falls / Self Care Management Nursing Diagnoses: Potential for falls Goals: Patient will not experience any injury related to falls Date Initiated: 05/13/2017 Target Resolution Date: 08/21/2017 Goal Status: Active Interventions: Assess Activities of Daily Living upon admission and as needed Assess fall risk on admission and as needed Assess: immobility, friction, shearing, incontinence upon admission and as needed Assess impairment of mobility on admission and as needed per policy Assess personal safety and home safety (as indicated) on admission and as needed Assess self  care needs on admission and as needed Notes: ` Nutrition Nursing Diagnoses: Imbalanced nutrition Potential for alteratiion in Nutrition/Potential for imbalanced nutrition Goals: Patient/caregiver agrees to and verbalizes understanding of  need to use nutritional supplements and/or vitamins as prescribed Date Initiated: 05/13/2017 Target Resolution Date: 09/18/2017 Goal Status: Active Interventions: Assess patient nutrition upon admission and as needed per policy Notes: ` Orientation to the Wound Care Program Nursing Diagnoses: Knowledge deficit related to the wound healing center program TOBIE, PERDUE ([id number]) Goals: Patient/caregiver will verbalize understanding of the Wound Healing Center Program Date Initiated: 05/13/2017 Target Resolution Date: 06/19/2017 Goal Status: Active Interventions: Provide education on orientation to the wound center Notes: ` Pain, Acute or Chronic Nursing Diagnoses: Pain, acute or chronic: actual or potential Potential alteration in comfort, pain Goals: Patient/caregiver will verbalize adequate pain control between visits Date Initiated: 05/13/2017 Target Resolution Date: 09/18/2017 Goal Status: Active Interventions: Complete pain assessment as per visit requirements Notes: ` Wound/Skin Impairment Nursing Diagnoses: Impaired tissue integrity Knowledge deficit related to ulceration/compromised skin integrity Goals: Ulcer/skin breakdown will have a volume reduction of 80% by week 12 Date Initiated: 05/13/2017 Target Resolution Date: 09/11/2017 Goal Status: Active Interventions: Assess patient/caregiver ability to perform ulcer/skin care regimen upon admission and as needed Assess ulceration(s) every visit Notes: Electronic Signature(s) Signed: 06/03/2017 4:33:19 PM By: Renne Crigler Entered By: Renne Crigler on 06/03/2017 13:26:07 Lydia Buchanan ([id number]) -------------------------------------------------------------------------------- Pain Assessment Details Patient Name: Lydia Buchanan Date of Service: 06/03/2017 1:15 PM Medical Record Number: [id number] Patient Account Number: [id number] Date of Birth/Sex: October 15, 1932 (84 y.o. F) Treating RN: Curtis Sites Primary  Care Eleonor Ocon: PATIENT, NO Other Clinician: Referring Lus Kriegel: Franchot Mimes Treating Travia Onstad/Extender: Kathreen Cosier in Treatment: 3 Active Problems Location of Pain Severity and Description of Pain Patient Has Paino Yes Site Locations Pain Location: Pain in Ulcers With Dressing Change: Yes Duration of the Pain. Constant / Intermittento Intermittent Pain Management and Medication Current Pain Management: Electronic Signature(s) Signed: 06/03/2017 5:31:29 PM By: Curtis Sites Entered By: Curtis Sites on 06/03/2017 13:05:23 Lydia Buchanan ([id number]) -------------------------------------------------------------------------------- Patient/Caregiver Education Details Patient Name: Lydia Buchanan Date of Service: 06/03/2017 1:15 PM Medical Record Number: [id number] Patient Account Number: [id number] Date of Birth/Gender: 1932-05-06 (84 y.o. F) Treating RN: Huel Coventry Primary Care Physician: PATIENT, NO Other Clinician: Referring Physician: Franchot Mimes Treating Physician/Extender: Kathreen Cosier in Treatment: 3 Education Assessment Education Provided To: Patient Education Topics Provided Venous: Handouts: Controlling Swelling with Compression Stockings , Other: continue wound care as prescribed Methods: Demonstration, Explain/Verbal Responses: State content correctly Wound/Skin Impairment: Handouts: Caring for Your Ulcer Methods: Demonstration Responses: State content correctly Electronic Signature(s) Signed: 06/03/2017 5:02:07 PM By: Elliot Gurney, BSN, RN, CWS, Kim RN, BSN Entered By: Elliot Gurney, BSN, RN, CWS, Kim on 06/03/2017 16:53:56 Lydia Buchanan ([id number]) -------------------------------------------------------------------------------- Wound Assessment Details Patient Name: Lydia Buchanan Date of Service: 06/03/2017 1:15 PM Medical Record Number: [id number] Patient Account Number: [id number] Date of Birth/Sex: 1932-08-20 (84 y.o. F) Treating RN: Curtis Sites Primary Care Prosperity Darrough: PATIENT, NO Other Clinician: Referring Daine Croker: Franchot Mimes Treating Shada Nienaber/Extender: Kathreen Cosier in Treatment: 3 Wound Status Wound Number: 1 Primary Venous Leg Ulcer Etiology: Wound Location: Right Lower Leg - Lateral Wound Status: Open Wounding Event: Gradually Appeared Comorbid Cataracts, Arrhythmia, Congestive Heart Date Acquired: 03/29/2017 History: Failure, Hypertension Weeks Of Treatment: 3 Clustered Wound: No Photos Photo Uploaded By: Curtis Sites on 06/03/2017 16:49:54 Wound Measurements Length: (cm) 0.7 Width: (cm) 0.6 Depth: (cm) 0.2 Area: (cm) 0.33 Volume: (cm) 0.066 % Reduction in Area: 12.5% % Reduction in  Volume: -73.7% Epithelialization: None Tunneling: No Undermining: No Wound Description Full Thickness Without Exposed Support Classification: Structures Wound Margin: Flat and Intact Exudate Small Amount: Exudate Type: Serous Exudate Color: amber Foul Odor After Cleansing: No Slough/Fibrino Yes Wound Bed Granulation Amount: Medium (34-66%) Exposed Structure Granulation Quality: Pink Fascia Exposed: No Necrotic Amount: Medium (34-66%) Fat Layer (Subcutaneous Tissue) Exposed: Yes Necrotic Quality: Adherent Slough Tendon Exposed: No Muscle Exposed: No Joint Exposed: No Bone Exposed: No Yale, Eveleigh ([id number]) Periwound Skin Texture Texture Color No Abnormalities Noted: No No Abnormalities Noted: No Callus: No Atrophie Blanche: No Crepitus: No Cyanosis: No Excoriation: No Ecchymosis: No Induration: No Erythema: No Rash: No Hemosiderin Staining: Yes Scarring: No Mottled: No Pallor: No Moisture Rubor: No No Abnormalities Noted: No Dry / Scaly: No Temperature / Pain Maceration: No Temperature: No Abnormality Tenderness on Palpation: Yes Wound Preparation Ulcer Cleansing: Wound Cleanser Topical Anesthetic Applied: Other: lidocaine 4%, Treatment Notes Wound #1 (Right,  Lateral Lower Leg) 1. Cleansed with: Clean wound with Normal Saline 2. Anesthetic Topical Lidocaine 4% cream to wound bed prior to debridement 4. Dressing Applied: Iodoflex 5. Secondary Dressing Applied ABD Pad 7. Secured with 3 Layer Compression System - Bilateral Electronic Signature(s) Signed: 06/03/2017 5:31:29 PM By: Curtis Sites Entered By: Curtis Sites on 06/03/2017 13:17:19 Lydia Buchanan ([id number]) -------------------------------------------------------------------------------- Wound Assessment Details Patient Name: Lydia Buchanan Date of Service: 06/03/2017 1:15 PM Medical Record Number: [id number] Patient Account Number: [id number] Date of Birth/Sex: 12/11/1932 (84 y.o. F) Treating RN: Curtis Sites Primary Care Thanos Cousineau: PATIENT, NO Other Clinician: Referring Donald Jacque: Franchot Mimes Treating Matheu Ploeger/Extender: Kathreen Cosier in Treatment: 3 Wound Status Wound Number: 2 Primary Venous Leg Ulcer Etiology: Wound Location: Right Lower Leg - Posterior Wound Status: Open Wounding Event: Gradually Appeared Comorbid Cataracts, Arrhythmia, Congestive Heart Date Acquired: 03/29/2017 History: Failure, Hypertension Weeks Of Treatment: 3 Clustered Wound: No Photos Photo Uploaded By: Curtis Sites on 06/03/2017 16:49:55 Wound Measurements Length: (cm) 1.6 Width: (cm) 1.2 Depth: (cm) 0.2 Area: (cm) 1.508 Volume: (cm) 0.302 % Reduction in Area: 58.3% % Reduction in Volume: 16.3% Epithelialization: None Tunneling: No Undermining: No Wound Description Full Thickness Without Exposed Support Classification: Structures Wound Margin: Flat and Intact Exudate Large Amount: Exudate Type: Serous Exudate Color: amber Foul Odor After Cleansing: No Slough/Fibrino Yes Wound Bed Granulation Amount: Medium (34-66%) Exposed Structure Granulation Quality: Pink Fascia Exposed: No Necrotic Amount: Medium (34-66%) Fat Layer (Subcutaneous Tissue) Exposed:  Yes Necrotic Quality: Adherent Slough Tendon Exposed: No Muscle Exposed: No Joint Exposed: No Bone Exposed: No Tarnowski, Saraiah ([id number]) Periwound Skin Texture Texture Color No Abnormalities Noted: No No Abnormalities Noted: No Callus: No Atrophie Blanche: No Crepitus: No Cyanosis: No Excoriation: No Ecchymosis: No Induration: No Erythema: No Rash: No Hemosiderin Staining: Yes Scarring: No Mottled: No Pallor: No Moisture Rubor: No No Abnormalities Noted: No Dry / Scaly: No Temperature / Pain Maceration: No Temperature: No Abnormality Tenderness on Palpation: Yes Wound Preparation Ulcer Cleansing: Wound Cleanser Topical Anesthetic Applied: Other: lidocaine 4%, Treatment Notes Wound #2 (Right, Posterior Lower Leg) 1. Cleansed with: Clean wound with Normal Saline 2. Anesthetic Topical Lidocaine 4% cream to wound bed prior to debridement 4. Dressing Applied: Iodoflex 5. Secondary Dressing Applied ABD Pad 7. Secured with 3 Layer Compression System - Bilateral Electronic Signature(s) Signed: 06/03/2017 5:31:29 PM By: Curtis Sites Entered By: Curtis Sites on 06/03/2017 13:18:33 Lydia Buchanan ([id number]) -------------------------------------------------------------------------------- Wound Assessment Details Patient Name: Lydia Buchanan Date of Service: 06/03/2017 1:15 PM Medical Record Number: [id number] Patient Account Number: [id number] Date of  Birth/Sex: Dec 05, 1932 (84 y.o. F) Treating RN: Renne Crigler Primary Care Kaicen Desena: PATIENT, NO Other Clinician: Referring Karagan Lehr: Franchot Mimes Treating Aryan Sparks/Extender: Kathreen Cosier in Treatment: 3 Wound Status Wound Number: 3 Primary Venous Leg Ulcer Etiology: Wound Location: Left, Anterior Lower Leg Wound Status: Open Wounding Event: Gradually Appeared Comorbid Cataracts, Arrhythmia, Congestive Heart Date Acquired: 03/29/2017 History: Failure, Hypertension Weeks Of Treatment:  3 Clustered Wound: No Photos Photo Uploaded By: Curtis Sites on 06/03/2017 16:50:32 Wound Measurements Length: (cm) 0.6 Width: (cm) 0.7 Depth: (cm) 0.2 Area: (cm) 0.33 Volume: (cm) 0.066 % Reduction in Area: 80.5% % Reduction in Volume: 61.2% Epithelialization: None Tunneling: No Undermining: No Wound Description Full Thickness Without Exposed Support Classification: Structures Wound Margin: Flat and Intact Exudate Large Amount: Exudate Type: Serous Exudate Color: amber Foul Odor After Cleansing: No Slough/Fibrino Yes Wound Bed Granulation Amount: Medium (34-66%) Exposed Structure Granulation Quality: Red Fascia Exposed: No Necrotic Amount: Medium (34-66%) Fat Layer (Subcutaneous Tissue) Exposed: Yes Necrotic Quality: Adherent Slough Tendon Exposed: No Muscle Exposed: No Joint Exposed: No Bone Exposed: No Bruington, Evana ([id number]) Periwound Skin Texture Texture Color No Abnormalities Noted: No No Abnormalities Noted: No Callus: No Atrophie Blanche: No Crepitus: No Cyanosis: No Excoriation: No Ecchymosis: No Induration: No Erythema: No Rash: No Hemosiderin Staining: Yes Scarring: No Mottled: No Pallor: No Moisture Rubor: No No Abnormalities Noted: No Dry / Scaly: No Temperature / Pain Maceration: No Temperature: No Abnormality Tenderness on Palpation: Yes Wound Preparation Ulcer Cleansing: Wound Cleanser Topical Anesthetic Applied: Other: lidocaine 4%, Treatment Notes Wound #3 (Left, Anterior Lower Leg) 1. Cleansed with: Clean wound with Normal Saline 2. Anesthetic Topical Lidocaine 4% cream to wound bed prior to debridement 4. Dressing Applied: Iodoflex 5. Secondary Dressing Applied ABD Pad 7. Secured with 3 Layer Compression System - Bilateral Electronic Signature(s) Signed: 06/03/2017 4:33:19 PM By: Renne Crigler Entered By: Renne Crigler on 06/03/2017 13:25:54 Lydia Buchanan  ([id number]) -------------------------------------------------------------------------------- Vitals Details Patient Name: Lydia Buchanan Date of Service: 06/03/2017 1:15 PM Medical Record Number: [id number] Patient Account Number: [id number] Date of Birth/Sex: 1932/01/31 (84 y.o. F) Treating RN: Curtis Sites Primary Care Camron Monday: PATIENT, NO Other Clinician: Referring Champ Keetch: Franchot Mimes Treating Evonda Enge/Extender: Kathreen Cosier in Treatment: 3 Vital Signs Time Taken: 13:05 Temperature (F): 98.1 Height (in): 62 Pulse (bpm): 88 Weight (lbs): 112 Respiratory Rate (breaths/min): 18 Body Mass Index (BMI): 20.5 Blood Pressure (mmHg): 126/84 Reference Range: 80 - 120 mg / dl Electronic Signature(s) Signed: 06/03/2017 5:31:29 PM By: Curtis Sites Entered By: Curtis Sites on 06/03/2017 13:07:03

## 2017-06-10 ENCOUNTER — Encounter: Payer: Medicare Other | Admitting: Nurse Practitioner

## 2017-06-10 DIAGNOSIS — I83018 Varicose veins of right lower extremity with ulcer other part of lower leg: Secondary | ICD-10-CM | POA: Diagnosis not present

## 2017-06-13 NOTE — Progress Notes (Signed)
Lydia, Buchanan ([id number]) Visit Report for 06/10/2017 Chief Complaint Document Details Patient Name: Lydia Buchanan, Lydia Buchanan Date of Service: 06/10/2017 9:30 AM Medical Record Number: [id number] Patient Account Number: [id number] Date of Birth/Sex: [birth year] (84 y.o. F) Treating RN: Phillis Haggis Primary Care Provider: PATIENT, NO Other Clinician: Referring Provider: Franchot Mimes Treating Provider/Extender: Kathreen Cosier in Treatment: 4 Information Obtained from: Patient Chief Complaint She is here for evaluation of bilateral lower extremity ulcers Electronic Signature(s) Signed: 06/10/2017 11:29:48 AM By: Bonnell Public Entered By: Bonnell Public on 06/10/2017 11:29:47 Lydia Buchanan ([id number]) -------------------------------------------------------------------------------- Debridement Details Patient Name: Lydia Buchanan Date of Service: 06/10/2017 9:30 AM Medical Record Number: [id number] Patient Account Number: [id number] Date of Birth/Sex: 29-Oct-1932 (84 y.o. F) Treating RN: Phillis Haggis Primary Care Provider: PATIENT, NO Other Clinician: Referring Provider: Franchot Mimes Treating Provider/Extender: Kathreen Cosier in Treatment: 4 Debridement Performed for Wound #1 Right,Lateral Lower Leg Assessment: Performed By: Physician Bonnell Public, NP Debridement Type: Debridement Severity of Tissue Pre Fat layer exposed Debridement: Pre-procedure Verification/Time Yes - 10:06 Out Taken: Start Time: 10:06 Pain Control: Lidocaine 4% Topical Solution Total Area Debrided (L x W): 0.5 (cm) x 0.4 (cm) = 0.2 (cm) Tissue and other material Viable, Non-Viable, Slough, Subcutaneous, Fibrin/Exudate, Slough debrided: Level: Skin/Subcutaneous Tissue Debridement Description: Excisional Instrument: Blade Bleeding: Minimum Hemostasis Achieved: Pressure End Time: 10:07 Procedural Pain: 0 Post Procedural Pain: 0 Response to Treatment: Procedure was tolerated well Level  of Consciousness: Awake and Alert Post Procedure Vitals: Temperature: 98.0 Pulse: 106 Respiratory Rate: 18 Blood Pressure: Systolic Blood Pressure: 130 Diastolic Blood Pressure: 90 Post Debridement Measurements of Total Wound Length: (cm) 0.5 Width: (cm) 0.4 Depth: (cm) 0.3 Volume: (cm) 0.047 Character of Wound/Ulcer Post Debridement: Requires Further Debridement Severity of Tissue Post Debridement: Fat layer exposed Post Procedure Diagnosis Same as Pre-procedure Electronic Signature(s) Signed: 06/10/2017 11:29:07 AM By: Bonnell Public Signed: 06/11/2017 5:38:20 PM By: Orson Eva, Anyelin ([id number]) Entered By: Bonnell Public on 06/10/2017 11:29:07 Lydia Buchanan ([id number]) -------------------------------------------------------------------------------- Debridement Details Patient Name: Lydia Buchanan Date of Service: 06/10/2017 9:30 AM Medical Record Number: [id number] Patient Account Number: [id number] Date of Birth/Sex: May 27, 1932 (84 y.o. F) Treating RN: Phillis Haggis Primary Care Provider: PATIENT, NO Other Clinician: Referring Provider: Franchot Mimes Treating Provider/Extender: Kathreen Cosier in Treatment: 4 Debridement Performed for Wound #2 Right,Posterior Lower Leg Assessment: Performed By: Physician Bonnell Public, NP Debridement Type: Debridement Severity of Tissue Pre Fat layer exposed Debridement: Pre-procedure Verification/Time Yes - 10:06 Out Taken: Start Time: 10:07 Pain Control: Lidocaine 4% Topical Solution Total Area Debrided (L x W): 0.9 (cm) x 0.7 (cm) = 0.63 (cm) Tissue and other material Viable, Non-Viable, Slough, Subcutaneous, Fibrin/Exudate, Slough debrided: Level: Skin/Subcutaneous Tissue Debridement Description: Excisional Instrument: Blade Bleeding: Minimum Hemostasis Achieved: Pressure End Time: 10:09 Procedural Pain: 0 Post Procedural Pain: 0 Response to Treatment: Procedure was tolerated well Level of  Consciousness: Awake and Alert Post Procedure Vitals: Temperature: 98.0 Pulse: 106 Respiratory Rate: 18 Blood Pressure: Systolic Blood Pressure: 130 Diastolic Blood Pressure: 90 Post Debridement Measurements of Total Wound Length: (cm) 0.9 Width: (cm) 0.7 Depth: (cm) 0.3 Volume: (cm) 0.148 Character of Wound/Ulcer Post Debridement: Requires Further Debridement Severity of Tissue Post Debridement: Fat layer exposed Post Procedure Diagnosis Same as Pre-procedure Electronic Signature(s) Signed: 06/10/2017 11:29:25 AM By: Bonnell Public Signed: 06/11/2017 5:38:20 PM By: Orson Eva, Maryclare ([id number]) Entered By: Bonnell Public on 06/10/2017 11:29:25 Lydia Buchanan ([id number]) -------------------------------------------------------------------------------- HPI Details Patient Name: Lydia Buchanan Date of Service: 06/10/2017 9:30 AM Medical Record Number: [id number] Patient  Account Number: [id number] Date of Birth/Sex: [birth year] (84 y.o. F) Treating RN: Phillis Haggis Primary Care Provider: PATIENT, NO Other Clinician: Referring Provider: Franchot Mimes Treating Provider/Extender: Kathreen Cosier in Treatment: 4 History of Present Illness HPI Description: 05/13/17-She is here for initial evaluation of bilateral location big ulcers. She recently moved here from [place], resides at [facility]. She states that approximately 6-8 weeks ago she developed bilateral lower extremity edema with blisters which resulted in ulcerations. She has been going to [place] [facility] and was being treated with Xeroform and thigh-high TED hose compression. She was on Eliquis for atrial fibrillation, but that was DC'd approximately 3 weeks ago by cardiology when she was hospitalized after uncontrolled bleeding from the left lower extremity ulcer. She is extremely anxious, nervous and would prefer no debridement at today's appointment; repeating questions. We will  initiate home health. She is non-diabetic and a former smoker, quit  10 years ago. According to Epic records, she has been to the emergency department twice since arrival to [facility]; visit on 4/28 for BLE pain a DVT study was done and negative on 4/30 visit her daughter requested a psych evaluation r/t manipulative behavior and extreme anxiety. 05/20/17 on evaluation today patient's wounds actually show signs of being a little bit better compared to last week's evaluation that I was not the provider see her at that point. She does have some Slough noted on the surface of the wound beds but fortunately there does not appear to be any evidence of significant infection. No fevers, chills, nausea, or vomiting noted at this time. Patient does appear to be extremely anxious. 05/27/17-She is here in follow-up evaluation for bilateral lower extremity ulcers. She is extremely anxious. It is noted that last week there was bleeding complications with the left lower extremity ulcer, there is residual chemical cautery effect. We will defer debridement, overall there is improvement to these ulcers. We will continue with Iodosorb/Iodoflex and 3 layer compression wrap. 06/03/17-She is here in follow up evaluation for bilateral lower extremity ulcers. Home health was then able to evaluate on Monday; she was seen in the emergency room for anxiety/panic attack and taken home Tuesday evening. There has been improvement in ulceration since last evaluation and we will continue with the same treatment plan she will follow-up next week. 06/10/17- She is here in follow up evaluation for bilateral lower extremity ulcers. The left leg is almost healed; right leg is improved. She remains extremely anxious but did take the [place] today, which is an improvement. She is allowing home health to change her dressings; we will change dressings and she will follow up next week Electronic Signature(s) Signed: 06/10/2017 11:41:51 AM By:  Bonnell Public Entered By: Bonnell Public on 06/10/2017 11:41:51 Lydia Buchanan ([id number]) -------------------------------------------------------------------------------- Physician Orders Details Patient Name: Lydia Buchanan Date of Service: 06/10/2017 9:30 AM Medical Record Number: [id number] Patient Account Number: [id number] Date of Birth/Sex: 1932-08-07 (84 y.o. F) Treating RN: Phillis Haggis Primary Care Provider: PATIENT, NO Other Clinician: Referring Provider: Franchot Mimes Treating Provider/Extender: Kathreen Cosier in Treatment: 4 Verbal / Phone Orders: Yes Clinician: Pinkerton, Debi Read Back and Verified: Yes Diagnosis Coding Wound Cleansing Wound #1 Right,Lateral Lower Leg o Clean wound with Normal Saline. o Cleanse wound with mild soap and water Wound #2 Right,Posterior Lower Leg o Clean wound with Normal Saline. o Cleanse wound with mild soap and water Wound #3 Left,Anterior Lower Leg o Clean wound with Normal Saline. o Cleanse wound with mild soap and water Anesthetic (  add to Medication List) Wound #1 Right,Lateral Lower Leg o Topical Lidocaine 4% cream applied to wound bed prior to debridement (In Clinic Only). Wound #2 Right,Posterior Lower Leg o Topical Lidocaine 4% cream applied to wound bed prior to debridement (In Clinic Only). Wound #3 Left,Anterior Lower Leg o Topical Lidocaine 4% cream applied to wound bed prior to debridement (In Clinic Only). Primary Wound Dressing Wound #1 Right,Lateral Lower Leg o Hydrafera Blue Ready Transfer - please soak with warm soapy water before removing from wound Wound #2 Right,Posterior Lower Leg o Hydrafera Blue Ready Transfer - please soak with warm soapy water before removing from wound Wound #3 Left,Anterior Lower Leg o Hydrafera Blue Ready Transfer - Please be careful with this wound and do not scrub this wound!!! please soak with warm soapy water before removing from wound Secondary  Dressing Wound #1 Right,Lateral Lower Leg o ABD pad Wound #2 Right,Posterior Lower Leg o ABD pad Wound #3 Left,Anterior Lower Leg o ABD pad Rondinelli, Shelisa ([id number]) Dressing Change Frequency Wound #1 Right,Lateral Lower Leg o Dressing is to be changed Monday and Thursday. - HHRN to change dressings on Mondays and pt will be seen in Wound Care Clinic on Thursdays Wound #2 Right,Posterior Lower Leg o Dressing is to be changed Monday and Thursday. - HHRN to change dressings on Mondays and pt will be seen in Wound Care Clinic on Thursdays Wound #3 Left,Anterior Lower Leg o Dressing is to be changed Monday and Thursday. - HHRN to change dressings on Mondays and pt will be seen in Wound Care Clinic on Thursdays Follow-up Appointments Wound #1 Right,Lateral Lower Leg o Return Appointment in 1 week. Wound #2 Right,Posterior Lower Leg o Return Appointment in 1 week. Wound #3 Left,Anterior Lower Leg o Return Appointment in 1 week. Edema Control Wound #1 Right,Lateral Lower Leg o 3 Layer Compression System - Bilateral o Elevate legs to the level of the heart and pump ankles as often as possible Wound #2 Right,Posterior Lower Leg o 3 Layer Compression System - Bilateral o Elevate legs to the level of the heart and pump ankles as often as possible Wound #3 Left,Anterior Lower Leg o 3 Layer Compression System - Bilateral o Elevate legs to the level of the heart and pump ankles as often as possible Additional Orders / Instructions Wound #1 Right,Lateral Lower Leg o Increase protein intake. Wound #2 Right,Posterior Lower Leg o Increase protein intake. Wound #3 Left,Anterior Lower Leg o Increase protein intake. Home Health Wound #1 Right,Lateral Lower Leg o Continue Home Health Visits - no unna boots use a 3 - layer o Home Health Nurse may visit PRN to address patientos wound care needs. o FACE TO FACE ENCOUNTER: MEDICARE and MEDICAID  PATIENTS: I certify that this patient is under my care and that I had a face-to-face encounter that meets the physician face-to-face encounter requirements with this patient on this date. The encounter with the patient was in whole or in part for the following MEDICAL CONDITION: (primary reason for Home Healthcare) MEDICAL NECESSITY: I certify, that based on my findings, NURSING services are a medically necessary home health service. HOME BOUND STATUS: I certify that my clinical findings support that this patient is homebound (i.e., Due to illness or injury, pt requires aid of CLEA, DUBACH ([id number]) supportive devices such as crutches, cane, wheelchairs, walkers, the use of special transportation or the assistance of another person to leave their place of residence. There is a normal inability to leave the home and doing so  requires considerable and taxing effort. Other absences are for medical reasons / religious services and are infrequent or of short duration when for other reasons). o If current dressing causes regression in wound condition, may D/C ordered dressing product/s and apply Normal Saline Moist Dressing daily until next Wound Healing Center / Other MD appointment. Notify Wound Healing Center of regression in wound condition at [phone]. o Please direct any NON-WOUND related issues/requests for orders to patient's Primary Care Physician Wound #2 Right,Posterior Lower Leg o Continue Home Health Visits - no unna boots use a 3 - layer o Home Health Nurse may visit PRN to address patientos wound care needs. o FACE TO FACE ENCOUNTER: MEDICARE and MEDICAID PATIENTS: I certify that this patient is under my care and that I had a face-to-face encounter that meets the physician face-to-face encounter requirements with this patient on this date. The encounter with the patient was in whole or in part for the following MEDICAL CONDITION: (primary reason for Home Healthcare)  MEDICAL NECESSITY: I certify, that based on my findings, NURSING services are a medically necessary home health service. HOME BOUND STATUS: I certify that my clinical findings support that this patient is homebound (i.e., Due to illness or injury, pt requires aid of supportive devices such as crutches, cane, wheelchairs, walkers, the use of special transportation or the assistance of another person to leave their place of residence. There is a normal inability to leave the home and doing so requires considerable and taxing effort. Other absences are for medical reasons / religious services and are infrequent or of short duration when for other reasons). o If current dressing causes regression in wound condition, may D/C ordered dressing product/s and apply Normal Saline Moist Dressing daily until next Wound Healing Center / Other MD appointment. Notify Wound Healing Center of regression in wound condition at [phone]. o Please direct any NON-WOUND related issues/requests for orders to patient's Primary Care Physician Wound #3 Left,Anterior Lower Leg o Continue Home Health Visits - no unna boots use a 3 - layer o Home Health Nurse may visit PRN to address patientos wound care needs. o FACE TO FACE ENCOUNTER: MEDICARE and MEDICAID PATIENTS: I certify that this patient is under my care and that I had a face-to-face encounter that meets the physician face-to-face encounter requirements with this patient on this date. The encounter with the patient was in whole or in part for the following MEDICAL CONDITION: (primary reason for Home Healthcare) MEDICAL NECESSITY: I certify, that based on my findings, NURSING services are a medically necessary home health service. HOME BOUND STATUS: I certify that my clinical findings support that this patient is homebound (i.e., Due to illness or injury, pt requires aid of supportive devices such as crutches, cane, wheelchairs, walkers, the use of  special transportation or the assistance of another person to leave their place of residence. There is a normal inability to leave the home and doing so requires considerable and taxing effort. Other absences are for medical reasons / religious services and are infrequent or of short duration when for other reasons). o If current dressing causes regression in wound condition, may D/C ordered dressing product/s and apply Normal Saline Moist Dressing daily until next Wound Healing Center / Other MD appointment. Notify Wound Healing Center of regression in wound condition at [phone]. o Please direct any NON-WOUND related issues/requests for orders to patient's Primary Care Physician Patient Medications Allergies: Macrobid, oxycodone Notifications Medication Indication Start End lidocaine DOSE 1 - topical 4 %  cream - 1 cream topical Electronic Signature(s) Signed: 06/11/2017 6:54:27 AM By: Bonnell Public Signed: 06/11/2017 5:38:20 PM By: Orson Eva, Tieisha ([id number]) Entered By: Alejandro Mulling on 06/10/2017 10:11:13 Lydia Buchanan ([id number]) -------------------------------------------------------------------------------- Prescription 06/10/2017 Patient Name: Lydia Buchanan Provider: Bonnell Public NP Date of Birth: Feb 16, 1932 NPI#: [id number] Sex: F DEA#: [id number] Phone #: [phone] License #: Patient Address: [facility] [address] [facility] [address] [place], [place] [address] [place], [place] [address] [phone] Allergies Macrobid oxycodone Medication Medication: Route: Strength: Form: lidocaine topical 4% cream Class: TOPICAL LOCAL ANESTHETICS Dose: Frequency / Time: Indication: 1 1 cream topical Number of Refills: Number of Units: 0 Generic Substitution: Start Date: End Date: Administered at Substitution Permitted  Facility: Yes Time Administered: Time Discontinued: Note to Pharmacy: Signature(s): Date(s): Electronic Signature(s) Signed: 06/11/2017 6:54:27 AM By: Bonnell Public Signed: 06/11/2017 5:38:20 PM By: Orson Eva, Luca ([id number]) Entered By: Alejandro Mulling on 06/10/2017 10:11:15 Lydia Buchanan ([id number]) --------------------------------------------------------------------------------  Problem List Details Patient Name: Lydia Buchanan Date of Service: 06/10/2017 9:30 AM Medical Record Number: [id number] Patient Account Number: [id number] Date of Birth/Sex: 11-23-1932 (84 y.o. 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 05/13/2017 No Yes complications L97.212 Non-pressure chronic ulcer of right calf with fat layer exposed 05/13/2017 No Yes L97.222 Non-pressure chronic ulcer of left calf with fat layer exposed 05/13/2017 No Yes Inactive Problems Resolved Problems Electronic Signature(s) Signed: 06/10/2017 10:26:35 AM By: Bonnell Public Entered By: Bonnell Public on 06/10/2017 10:26:34 Lydia Buchanan ([id number]) -------------------------------------------------------------------------------- Progress Note Details Patient Name: Lydia Buchanan Date of Service: 06/10/2017 9:30 AM Medical Record Number: [id number] Patient Account Number: [id number] Date of Birth/Sex: 1932/04/13 (84 y.o. F) Treating RN: Phillis Haggis Primary Care Provider: PATIENT, NO Other Clinician: Referring Provider: Franchot Mimes Treating Provider/Extender: Kathreen Cosier in Treatment: 4 Subjective Chief Complaint Information obtained from Patient She is here for evaluation of bilateral  lower extremity ulcers History of Present Illness (HPI) 05/13/17-She is here for initial evaluation of bilateral location big ulcers. She recently moved here from [place], resides at [facility]. She states that approximately 6-8 weeks ago she developed bilateral lower extremity edema with blisters which resulted in ulcerations. She has been going to [place] wound clinic and was being treated with Xeroform and thigh-high TED hose compression. She was on Eliquis for atrial fibrillation, but that was DC'd approximately 3 weeks ago by cardiology when she was hospitalized after uncontrolled bleeding from the left lower extremity ulcer. She is extremely anxious, nervous and would prefer no debridement at today's appointment; repeating questions. We will initiate home health. She is non-diabetic and a former smoker, quit  10 years ago. According to Epic records, she has been to the emergency department twice since arrival to [facility]; visit on 4/28 for BLE pain a DVT study was done and negative on 4/30 visit her daughter requested a psych evaluation r/t manipulative behavior and extreme anxiety. 05/20/17 on evaluation today patient's wounds actually show signs of being a little bit better compared to last week's evaluation that I was not the provider see her at that point. She does have some Slough noted on the surface of the wound beds but fortunately there does not appear to be any evidence of  significant infection. No fevers, chills, nausea, or vomiting noted at this time. Patient does appear to be extremely anxious. 05/27/17-She is here in follow-up evaluation for bilateral lower extremity ulcers. She is extremely anxious. It is noted that last week there was bleeding complications with the left lower extremity ulcer, there is residual chemical cautery effect. We will defer debridement, overall there is improvement to these ulcers. We will continue with Iodosorb/Iodoflex and 3  layer compression wrap. 06/03/17-She is here in follow up evaluation for bilateral lower extremity ulcers. Home health was then able to evaluate on Monday; she was seen in the emergency room for anxiety/panic attack and taken home Tuesday evening. There has been improvement in ulceration since last evaluation and we will continue with the same treatment plan she will follow-up next week. 06/10/17- She is here in follow up evaluation for bilateral lower extremity ulcers. The left leg is almost healed; right leg is improved. She remains extremely anxious but did take the [place] today, which is an improvement. She is allowing home health to change her dressings; we will change dressings and she will follow up next week Patient History Information obtained from Patient. Family History Cancer - Child, Heart Disease - Mother,Father, Hypertension - Mother,Father, No family history of Diabetes, Hereditary Spherocytosis, Kidney Disease, Lung Disease, Seizures, Stroke, Thyroid Problems, Tuberculosis. Social History Former smoker - 10 years, Marital Status - Widowed, Alcohol Use - Never, Drug Use - No History, Caffeine Use - Daily. Medical And Surgical History Notes Neurologic TIA Hunsinger, Lanique ([id number]) Objective Constitutional Vitals Time Taken: 9:30 AM, Height: 62 in, Weight: 112 lbs, BMI: 20.5, Temperature: 98.0 F, Pulse: 106 bpm, Respiratory Rate: 18 breaths/min, Blood Pressure: 130/90 mmHg. General Notes: Patient states she is nervous. Integumentary (Hair, Skin) Wound #1 status is Open. Original cause of wound was Gradually Appeared. The wound is located on the Right,Lateral Lower Leg. The wound measures 0.5cm length x 0.4cm width x 0.2cm depth; 0.157cm^2 area and 0.031cm^3 volume. Wound #2 status is Open. Original cause of wound was Gradually Appeared. The wound is located on the Right,Posterior Lower Leg. The wound measures 0.9cm length x 0.7cm width x 0.2cm depth; 0.495cm^2 area and  0.099cm^3 volume. Wound #3 status is Open. Original cause of wound was Gradually Appeared. The wound is located on the Left,Anterior Lower Leg. The wound measures 0.2cm length x 0.2cm width x 0.1cm depth; 0.031cm^2 area and 0.003cm^3 volume. 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 There was a Excisional Skin/Subcutaneous Tissue Debridement with a total area of 0.2 sq cm performed by Bonnell Public, NP. With the following instrument(s): Blade to remove Viable and Non-Viable tissue/material. Material removed includes Subcutaneous Tissue, Slough, and Fibrin/Exudate after achieving pain control using Lidocaine 4% Topical Solution. No specimens were taken. A time out was conducted at 10:06, prior to the start of the procedure. A Minimum amount of bleeding was controlled with Pressure. The procedure was tolerated well with a pain level of 0 throughout and a pain level of 0 following the procedure. Patient s Level of Consciousness post procedure was recorded as Awake and Alert. Post Debridement Measurements: 0.5cm length x 0.4cm width x 0.3cm depth;  0.047cm^3 volume. Character of Wound/Ulcer Post Debridement requires further debridement. Severity of Tissue Post Debridement is: Fat layer Verga, Jearldean ([id number]) exposed. Post procedure Diagnosis Wound #1: Same as Pre-Procedure Wound #2 Pre-procedure diagnosis of Wound #2 is a Venous Leg Ulcer located on the Right,Posterior Lower Leg .Severity of Tissue Pre Debridement is: Fat layer exposed. There was a Excisional Skin/Subcutaneous Tissue Debridement with a  total area of 0.63 sq cm performed by Bonnell Publicoulter, Tran Randle, NP. With the following instrument(s): Blade to remove Viable and Non-Viable tissue/material. Material removed includes Subcutaneous Tissue, Slough, and Fibrin/Exudate after achieving pain control using Lidocaine 4% Topical Solution. No specimens were taken. A time out was conducted at 10:06, prior to the start of the procedure. A Minimum amount of bleeding was controlled with Pressure. The procedure was tolerated well with a pain level of 0 throughout and a pain level of 0 following the procedure. Patient s Level of Consciousness post procedure was recorded as Awake and Alert. Post Debridement Measurements: 0.9cm length x 0.7cm width x 0.3cm depth; 0.148cm^3 volume. Character of Wound/Ulcer Post Debridement requires further debridement. Severity of Tissue Post Debridement is: Fat layer exposed. Post procedure Diagnosis Wound #2: Same as Pre-Procedure Plan Wound Cleansing: Wound #1 Right,Lateral Lower Leg: Clean wound with Normal Saline. Cleanse wound with mild soap and water Wound #2 Right,Posterior Lower Leg: Clean wound with Normal Saline. Cleanse wound with mild soap and water Wound #3 Left,Anterior Lower Leg: Clean wound with Normal Saline. Cleanse wound with mild soap and water Anesthetic (add to Medication List): Wound #1 Right,Lateral Lower Leg: Topical Lidocaine 4% cream applied to wound bed prior to debridement (In Clinic Only). Wound #2 Right,Posterior Lower Leg: Topical Lidocaine 4% cream applied to wound bed prior to debridement (In Clinic Only). Wound #3 Left,Anterior Lower Leg: Topical Lidocaine 4% cream applied to wound bed prior to debridement (In Clinic Only). Primary Wound Dressing: Wound #1 Right,Lateral Lower Leg: Hydrafera Blue Ready Transfer - please soak with warm soapy water before removing from wound Wound #2 Right,Posterior Lower Leg: Hydrafera Blue Ready Transfer - please soak with warm soapy water before  removing from wound Wound #3 Left,Anterior Lower Leg: Hydrafera Blue Ready Transfer - Please be careful with this wound and do not scrub this wound!!! please soak with warm soapy water before removing from wound Secondary Dressing: Wound #1 Right,Lateral Lower Leg: ABD pad Wound #2 Right,Posterior Lower Leg: ABD pad Wound #3 Left,Anterior Lower Leg: ABD pad Dressing Change Frequency: Wound #1 Right,Lateral Lower Leg: Dressing is to be changed Monday and Thursday. - HHRN to change dressings on Mondays and [id number][facility] on Thursdays Wound #2 Right,Posterior Lower Leg: Dressing is to be changed Monday and Thursday. - HHRN to change dressings on Mondays and pt will be seen in Wound Care Clinic on Thursdays Wound #3 Left,Anterior Lower Leg: Dressing is to be changed Monday and Thursday. - HHRN to change dressings on Mondays and pt will be seen in Wound Care Clinic on Thursdays Follow-up Appointments: Wound #1 Right,Lateral Lower Leg: Return Appointment in 1 week. Wound #2 Right,Posterior Lower Leg: Return Appointment in 1 week. Wound #3 Left,Anterior Lower Leg: Return Appointment in 1 week. Edema Control: Wound #1 Right,Lateral Lower Leg: 3 Layer Compression System - Bilateral Elevate legs to the level of the heart and pump ankles as often as possible Wound #2 Right,Posterior Lower Leg: 3 Layer Compression System - Bilateral Elevate legs to the level of the heart and pump  ankles as often as possible Wound #3 Left,Anterior Lower Leg: 3 Layer Compression System - Bilateral Elevate legs to the level of the heart and pump ankles as often as possible Additional Orders / Instructions: Wound #1 Right,Lateral Lower Leg: Increase protein intake. Wound #2 Right,Posterior Lower Leg: Increase protein intake. Wound #3 Left,Anterior Lower Leg: Increase protein intake. Home Health: Wound #1 Right,Lateral Lower Leg: Continue Home Health  Visits - no unna boots use a 3 - layer Home Health Nurse may visit PRN to address patient s wound care needs. FACE TO FACE ENCOUNTER: MEDICARE and MEDICAID PATIENTS: I certify that this patient is under my care and that I had a face-to-face encounter that meets the physician face-to-face encounter requirements with this patient on this date. The encounter with the patient was in whole or in part for the following MEDICAL CONDITION: (primary reason for Home Healthcare) MEDICAL NECESSITY: I certify, that based on my findings, NURSING services are a medically necessary home health service. HOME BOUND STATUS: I certify that my clinical findings support that this patient is homebound (i.e., Due to illness or injury, pt requires aid of supportive devices such as crutches, cane, wheelchairs, walkers, the use of special transportation or the assistance of another person to leave their place of residence. There is a normal inability to leave the home and doing so requires considerable and taxing effort. Other absences are for medical reasons / religious services and are infrequent or of short duration when for other reasons). If current dressing causes regression in wound condition, may D/C ordered dressing product/s and apply Normal Saline Moist Dressing daily until next Wound Healing Center / Other MD appointment. Notify Wound Healing Center of regression in wound condition at [phone]. Please direct any NON-WOUND related issues/requests for orders to patient's Primary Care Physician Wound #2 Right,Posterior Lower Leg: Continue Home Health Visits - no unna boots use a 3 - layer Home Health Nurse may visit PRN to address patient s wound care needs. FACE TO FACE ENCOUNTER: MEDICARE and MEDICAID PATIENTS: I certify that this patient is under my care and that I had a face-to-face encounter that meets the physician face-to-face encounter requirements with this patient on this date. The encounter with the  patient was in whole or in part for the following MEDICAL CONDITION: (primary reason for Home Healthcare) MEDICAL NECESSITY: I certify, that based on my findings, NURSING services are a medically necessary home health service. HOME BOUND STATUS: I certify that my clinical findings support that this patient is homebound (i.e., Due to illness or injury, pt requires aid of supportive devices such as crutches, cane, wheelchairs, walkers, the use of special transportation or the assistance of another person to leave their place of residence. There is a normal inability to leave the home and doing so requires considerable and taxing effort. Other absences are for medical reasons / religious services and are infrequent or of short duration when for other reasons). Stanton, Natalynn ([id number]) If current dressing causes regression in wound condition, may D/C ordered dressing product/s and apply Normal Saline Moist Dressing daily until next Wound Healing Center / Other MD appointment. Notify Wound Healing Center of regression in wound condition at [phone]. Please direct any NON-WOUND related issues/requests for orders to patient's Primary Care Physician Wound #3 Left,Anterior Lower Leg: Continue Home Health Visits - no unna boots use a 3 - layer Home Health Nurse may visit PRN to address patient s wound care needs. FACE TO FACE ENCOUNTER: MEDICARE and MEDICAID PATIENTS: I  certify that this patient is under my care and that I had a face-to-face encounter that meets the physician face-to-face encounter requirements with this patient on this date. The encounter with the patient was in whole or in part for the following MEDICAL CONDITION: (primary reason for Home Healthcare) MEDICAL NECESSITY: I certify, that based on my findings, NURSING services are a medically necessary home health service. HOME BOUND STATUS: I certify that my clinical findings support that this patient is homebound (i.e., Due  to illness or injury, pt requires aid of supportive devices such as crutches, cane, wheelchairs, walkers, the use of special transportation or the assistance of another person to leave their place of residence. There is a normal inability to leave the home and doing so requires considerable and taxing effort. Other absences are for medical reasons / religious services and are infrequent or of short duration when for other reasons). If current dressing causes regression in wound condition, may D/C ordered dressing product/s and apply Normal Saline Moist Dressing daily until next Wound Healing Center / Other MD appointment. Notify Wound Healing Center of regression in wound condition at [phone]. Please direct any NON-WOUND related issues/requests for orders to patient's Primary Care Physician The following medication(s) was prescribed: lidocaine topical 4 % cream 1 1 cream topical was prescribed at facility Electronic Signature(s) Signed: 06/10/2017 11:42:43 AM By: Bonnell Public Entered By: Bonnell Public on 06/10/2017 11:42:43 Lydia Buchanan ([id number]) -------------------------------------------------------------------------------- ROS/PFSH Details Patient Name: Lydia Buchanan Date of Service: 06/10/2017 9:30 AM Medical Record Number: [id number] Patient Account Number: [id number] Date of Birth/Sex: January 19, 1932 (84 y.o. F) Treating RN: Phillis Haggis Primary Care Provider: PATIENT, NO Other Clinician: Referring Provider: Franchot Mimes Treating Provider/Extender: Kathreen Cosier in Treatment: 4 Information Obtained From Patient Wound History Do you currently have one or more open woundso Yes How many open wounds do you currently haveo 2 Approximately how long have you had your woundso 6 weeks How have you been treating your wound(s) until nowo unknown Has your wound(s) ever healed and then re-openedo No Have you had any lab work done in the past montho No Have you tested  positive for an antibiotic resistant organism (MRSA, VRE)o No Have you tested positive for osteomyelitis (bone infection)o No Have you had any tests for circulation on your legso No Eyes Medical History: Positive for: Cataracts - removed Negative for: Glaucoma; Optic Neuritis Ear/Nose/Mouth/Throat Medical History: Negative for: Chronic sinus problems/congestion; Middle ear problems Hematologic/Lymphatic Medical History: Negative for: Anemia; Hemophilia; Human Immunodeficiency Virus; Lymphedema; Sickle Cell Disease Respiratory Medical History: Negative for: Aspiration; Asthma; Chronic Obstructive Pulmonary Disease (COPD); Pneumothorax; Sleep Apnea; Tuberculosis Cardiovascular Medical History: Positive for: Arrhythmia - a fib; Congestive Heart Failure; Hypertension Negative for: Angina; Coronary Artery Disease; Deep Vein Thrombosis; Hypotension; Myocardial Infarction; Peripheral Arterial Disease; Peripheral Venous Disease; Phlebitis; Vasculitis Gastrointestinal Medical History: Negative for: Cirrhosis ; Colitis; Crohnos; Hepatitis A; Hepatitis B; Hepatitis C Endocrine AMINAH, ZABAWA ([id number]) Medical History: Negative for: Type I Diabetes; Type II Diabetes Genitourinary Medical History: Negative for: End Stage Renal Disease Immunological Medical History: Negative for: Lupus Erythematosus; Raynaudos; Scleroderma Integumentary (Skin) Medical History: Negative for: History of Burn; History of pressure wounds Musculoskeletal Medical History: Negative for: Gout; Rheumatoid Arthritis; Osteoarthritis; Osteomyelitis Neurologic Medical History: Negative for: Dementia; Neuropathy; Quadriplegia; Paraplegia; Seizure Disorder Past Medical History Notes: TIA Oncologic Medical History: Negative for: Received Chemotherapy; Received Radiation Psychiatric Medical History: Negative for: Anorexia/bulimia; Confinement Anxiety HBO Extended History  Items Eyes: Cataracts Immunizations Pneumococcal Vaccine: Received Pneumococcal Vaccination: Yes Implantable  Devices Family and Social History Cancer: Yes - Child; Diabetes: No; Heart Disease: Yes - Mother,Father; Hereditary Spherocytosis: No; Hypertension: Yes - Mother,Father; Kidney Disease: No; Lung Disease: No; Seizures: No; Stroke: No; Thyroid Problems: No; Tuberculosis: No; Former smoker - 10 years; Marital Status - Widowed; Alcohol Use: Never; Drug Use: No History; Caffeine Use: Daily; Financial Concerns: No; Food, Clothing or Shelter Needs: No; Support System Lacking: No; Transportation Concerns: No; Advanced Directives: No; Patient does not want information on Advanced Directives Physician Affirmation I have reviewed and agree with the above information. SHANTAI, TIEDEMAN ([id number]) Electronic Signature(s) Signed: 06/11/2017 6:54:27 AM By: Bonnell Public Signed: 06/11/2017 5:38:20 PM By: Alejandro Mulling Entered By: Bonnell Public on 06/10/2017 11:42:25 Lydia Buchanan ([id number]) -------------------------------------------------------------------------------- SuperBill Details Patient Name: Lydia Buchanan Date of Service: 06/10/2017 Medical Record Number: [id number] Patient Account Number: [id number] Date of Birth/Sex: [birth year] (84 y.o. 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

## 2017-06-13 NOTE — Progress Notes (Signed)
Lydia Buchanan, Natashia ([id number]) Visit Report for 06/10/2017 Arrival Information Details Patient Name: Lydia Buchanan, Lydia Buchanan Date of Service: 06/10/2017 9:30 AM Medical Record Number: [id number] Patient Account Number: [id number] Date of Birth/Sex: 09/21/1932 (84 y.o. F) Treating RN: Huel CoventryWoody, Kim Primary Care Linden Tagliaferro: PATIENT, NO Other Clinician: Referring Ayad Nieman: Franchot MimesBISSELL, BRAD Treating Daylee Delahoz/Extender: Kathreen Cosieroulter, Leah Weeks in Treatment: 4 Visit Information History Since Last Visit Added or deleted any medications: No Patient Arrived: Walker Any new allergies or adverse reactions: No Arrival Time: 09:28 Had a fall or experienced change in No Accompanied By: friend in lobby activities of daily living that may affect Transfer Assistance: None risk of falls: Patient Identification Verified: Yes Signs or symptoms of abuse/neglect since last visito No Secondary Verification Process Completed: Yes Hospitalized since last visit: No Implantable device outside of the clinic excluding No cellular tissue based products placed in the center since last visit: Has Dressing in Place as Prescribed: Yes Has Compression in Place as Prescribed: Yes Pain Present Now: No Electronic Signature(s) Signed: 06/10/2017 5:52:53 PM By: Elliot GurneyWoody, BSN, RN, CWS, Kim RN, BSN Entered By: Elliot GurneyWoody, BSN, RN, CWS, Kim on 06/10/2017 09:29:57 Lydia Buchanan, Natajah ([id number]) -------------------------------------------------------------------------------- Encounter Discharge Information Details Patient Name: Lydia Buchanan, Lydia Buchanan Date of Service: 06/10/2017 9:30 AM Medical Record Number: [id number] Patient Account Number: [id number] Date of Birth/Sex: 06/03/1932 (84 y.o. F) Treating RN: Renne CriglerFlinchum, Cheryl Primary Care Chihiro Frey: PATIENT, NO Other Clinician: Referring Janthony Holleman: Franchot MimesBISSELL, BRAD Treating Nyimah Shadduck/Extender: Kathreen Cosieroulter, Leah Weeks in Treatment: 4 Encounter Discharge Information Items Discharge Condition: Stable Ambulatory Status:  Walker Discharge Destination: Skilled Nursing Facility Telephoned: No Orders Sent: Yes Transportation: Other Schedule Follow-up Appointment: Yes Clinical Summary of Care: Electronic Signature(s) Signed: 06/10/2017 11:59:50 AM By: Renne CriglerFlinchum, Cheryl Entered By: Renne CriglerFlinchum, Cheryl on 06/10/2017 10:32:42 Lydia Buchanan, Jovanka ([id number]) -------------------------------------------------------------------------------- Lower Extremity Assessment Details Patient Name: Lydia Buchanan, Lydia Buchanan Date of Service: 06/10/2017 9:30 AM Medical Record Number: [id number] Patient Account Number: [id number] Date of Birth/Sex: 11/11/1932 (84 y.o. F) Treating RN: Huel CoventryWoody, Kim Primary Care Jess Sulak: PATIENT, NO Other Clinician: Referring Jalaysha Skilton: Franchot MimesBISSELL, BRAD Treating Selden Noteboom/Extender: Kathreen Cosieroulter, Leah Weeks in Treatment: 4 Edema Assessment Assessed: [Left: No] [Right: No] E[Left: dema] [Right: :] Calf Left: Right: Point of Measurement: 32 cm From Medial Instep 31.5 cm 30.5 cm Ankle Left: Right: Point of Measurement: 10 cm From Medial Instep 20.5 cm 19.5 cm Vascular Assessment Claudication: Claudication Assessment [Left:None] [Right:None] Pulses: Dorsalis Pedis Palpable: [Left:Yes] [Right:Yes] Posterior Tibial Extremity colors, hair growth, and conditions: Extremity Color: [Left:Normal] [Right:Normal] Hair Growth on Extremity: [Left:Yes] [Right:Yes] Temperature of Extremity: [Left:Warm] [Right:Warm] Capillary Refill: [Left:< 3 seconds] [Right:< 3 seconds] Toe Nail Assessment Left: Right: Thick: Yes Yes Deformed: No No Improper Length and Hygiene: No No Notes Patients toe nails are painted. Electronic Signature(s) Signed: 06/10/2017 5:52:53 PM By: Elliot GurneyWoody, BSN, RN, CWS, Kim RN, BSN Entered By: Elliot GurneyWoody, BSN, RN, CWS, Kim on 06/10/2017 09:45:48 Lydia Buchanan, Lydia Buchanan ([id number]) -------------------------------------------------------------------------------- Multi Wound Chart Details Patient Name: Lydia Buchanan,  Lydia Buchanan Date of Service: 06/10/2017 9:30 AM Medical Record Number: [id number] Patient Account Number: [id number] Date of Birth/Sex: 09/24/1932 (84 y.o. F) Treating RN: Phillis HaggisPinkerton, Debi Primary Care Margerite Impastato: PATIENT, NO Other Clinician: Referring Shanvi Moyd: Franchot MimesBISSELL, BRAD Treating Jermario Kalmar/Extender: Kathreen Cosieroulter, Leah Weeks in Treatment: 4 Vital Signs Height(in): 62 Pulse(bpm): 106 Weight(lbs): 112 Blood Pressure(mmHg): 130/90 Body Mass Index(BMI): 20 Temperature(F): 98.0 Respiratory Rate 18 (breaths/min): Photos: [1:No Photos] [2:No Photos] [3:No Photos] Wound Location: [1:Right, Lateral Lower Leg] [2:Right, Posterior Lower Leg] [3:Left, Anterior Lower Leg] Wounding Event: [1:Gradually Appeared] [2:Gradually Appeared] [3:Gradually Appeared] Primary Etiology: [1:Venous Leg Ulcer] [2:Venous Leg Ulcer] [3:Venous  Leg Ulcer] Date Acquired: [1:03/29/2017] [2:03/29/2017] [3:03/29/2017] Weeks of Treatment: [1:4] [2:4] [3:4] Wound Status: [1:Open] [2:Open] [3:Open] Measurements L x W x D [1:0.5x0.4x0.2] [2:0.9x0.7x0.2] [3:0.2x0.2x0.1] (cm) Area (cm) : [1:0.157] [2:0.495] [3:0.031] Volume (cm) : [1:0.031] [2:0.099] [3:0.003] % Reduction in Area: [1:58.40%] [2:86.30%] [3:98.20%] % Reduction in Volume: [1:18.40%] [2:72.60%] [3:98.20%] Classification: [1:Full Thickness Without Exposed Support Structures] [2:Full Thickness Without Exposed Support Structures] [3:Full Thickness Without Exposed Support Structures] Debridement: [1:Debridement - Excisional] [2:Debridement - Excisional] [3:N/A] Pre-procedure [1:10:06] [2:10:06] [3:N/A] Verification/Time Out Taken: Pain Control: [1:Lidocaine 4% Topical Solution] [2:Lidocaine 4% Topical Solution] [3:N/A] Tissue Debrided: [1:Subcutaneous, Slough] [2:Subcutaneous, Slough] [3:N/A] Level: [1:Skin/Subcutaneous Tissue] [2:Skin/Subcutaneous Tissue] [3:N/A] Debridement Area (sq cm): [1:0.2] [2:0.63] [3:N/A] Instrument: [1:Blade] [2:Blade] [3:N/A] Bleeding:  [1:Minimum] [2:Minimum] [3:N/A] Hemostasis Achieved: [1:Pressure] [2:Pressure] [3:N/A] Procedural Pain: [1:0] [2:0] [3:N/A] Post Procedural Pain: [1:0] [2:0] [3:N/A] Debridement Treatment [1:Procedure was tolerated well] [2:Procedure was tolerated well] [3:N/A] Response: Post Debridement [1:0.5x0.4x0.3] [2:0.9x0.7x0.3] [3:N/A] Measurements L x W x D (cm) Post Debridement Volume: [1:0.047] [2:0.148] [3:N/A] (cm) Periwound Skin Texture: [1:No Abnormalities Noted] [2:No Abnormalities Noted] [3:No Abnormalities Noted] Periwound Skin Moisture: [1:No Abnormalities Noted] [2:No Abnormalities Noted] [3:No Abnormalities Noted] Periwound Skin Color: [1:No Abnormalities Noted] [2:No Abnormalities Noted] [3:No Abnormalities Noted] Tenderness on Palpation: No No No Procedures Performed: Debridement Debridement N/A Treatment Notes Electronic Signature(s) Signed: 06/10/2017 10:27:01 AM By: Bonnell Public Entered By: Bonnell Public on 06/10/2017 10:27:00 Lydia Buchanan ([id number]) -------------------------------------------------------------------------------- Multi-Disciplinary Care Plan Details Patient Name: Lydia Buchanan Date of Service: 06/10/2017 9:30 AM Medical Record Number: [id number] Patient Account Number: [id number] Date of Birth/Sex: January 31, 1932 (84 y.o. F) Treating RN: Phillis Haggis Primary Care Ritik Stavola: PATIENT, NO Other Clinician: Referring Maxine Huynh: Franchot Mimes Treating Trease Bremner/Extender: Kathreen Cosier in Treatment: 4 Active Inactive ` Abuse / Safety / Falls / Self Care Management Nursing Diagnoses: Potential for falls Goals: Patient will not experience any injury related to falls Date Initiated: 05/13/2017 Target Resolution Date: 08/21/2017 Goal Status: Active Interventions: Assess Activities of Daily Living upon admission and as needed Assess fall risk on admission and as needed Assess: immobility, friction, shearing, incontinence upon admission and as  needed Assess impairment of mobility on admission and as needed per policy Assess personal safety and home safety (as indicated) on admission and as needed Assess self care needs on admission and as needed Notes: ` Nutrition Nursing Diagnoses: Imbalanced nutrition Potential for alteratiion in Nutrition/Potential for imbalanced nutrition Goals: Patient/caregiver agrees to and verbalizes understanding of need to use nutritional supplements and/or vitamins as prescribed Date Initiated: 05/13/2017 Target Resolution Date: 09/18/2017 Goal Status: Active Interventions: Assess patient nutrition upon admission and as needed per policy Notes: ` Orientation to the Wound Care Program Nursing Diagnoses: Knowledge deficit related to the wound healing center program [facility] ([id number]) Goals: Patient/caregiver will verbalize understanding of the Wound Healing Center Program Date Initiated: 05/13/2017 Target Resolution Date: 06/19/2017 Goal Status: Active Interventions: Provide education on orientation to the wound center Notes: ` Pain, Acute or Chronic Nursing Diagnoses: Pain, acute or chronic: actual or potential Potential alteration in comfort, pain Goals: Patient/caregiver will verbalize adequate pain control between visits Date Initiated: 05/13/2017 Target Resolution Date: 09/18/2017 Goal Status: Active Interventions: Complete pain assessment as per visit requirements Notes: ` Wound/Skin Impairment Nursing Diagnoses: Impaired tissue integrity Knowledge deficit related to ulceration/compromised skin integrity Goals: Ulcer/skin breakdown will have a volume reduction of 80% by week 12 Date Initiated: 05/13/2017 Target Resolution Date: 09/11/2017 Goal Status: Active Interventions: Assess patient/caregiver ability to perform ulcer/skin care regimen upon admission and as  needed Assess ulceration(s) every visit Notes: Electronic Signature(s) Signed: 06/11/2017 5:38:20 PM By:  Alejandro Mulling Entered By: Alejandro Mulling on 06/10/2017 10:04:45 Lydia Buchanan ([id number]) -------------------------------------------------------------------------------- Pain Assessment Details Patient Name: Lydia Buchanan Date of Service: 06/10/2017 9:30 AM Medical Record Number: [id number] Patient Account Number: [id number] Date of Birth/Sex: 1932/11/29 (84 y.o. F) Treating RN: Huel Coventry Primary Care Edom Schmuhl: PATIENT, NO Other Clinician: Referring Brecken Walth: Franchot Mimes Treating Leightyn Cina/Extender: Kathreen Cosier in Treatment: 4 Active Problems Location of Pain Severity and Description of Pain Patient Has Paino No Site Locations With Dressing Change: No Pain Management and Medication Current Pain Management: Goals for Pain Management Topical or injectable lidocaine is offered to patient for acute pain when surgical debridement is performed. If needed, Patient is instructed to use over the counter pain medication for the following 24-48 hours after debridement. Wound care MDs do not prescribed pain medications. Patient has chronic pain or uncontrolled pain. Patient has been instructed to make an appointment with their Primary Care Physician for pain management. Electronic Signature(s) Signed: 06/10/2017 5:52:53 PM By: Elliot Gurney, BSN, RN, CWS, Kim RN, BSN Entered By: Elliot Gurney, BSN, RN, CWS, Kim on 06/10/2017 09:30:08 Lydia Buchanan ([id number]) -------------------------------------------------------------------------------- Patient/Caregiver Education Details Patient Name: Lydia Buchanan Date of Service: 06/10/2017 9:30 AM Medical Record Number: [id number] Patient Account Number: [id number] Date of Birth/Gender: 27-Oct-1932 (84 y.o. F) Treating RN: Renne Crigler Primary Care Physician: PATIENT, NO Other Clinician: Referring Physician: Franchot Mimes Treating Physician/Extender: Kathreen Cosier in Treatment: 4 Education Assessment Education Provided  To: Patient Education Topics Provided Wound/Skin Impairment: Handouts: Caring for Your Ulcer Methods: Explain/Verbal Responses: State content correctly Electronic Signature(s) Signed: 06/10/2017 11:59:50 AM By: Renne Crigler Entered By: Renne Crigler on 06/10/2017 10:32:54 Lydia Buchanan ([id number]) -------------------------------------------------------------------------------- Wound Assessment Details Patient Name: Lydia Buchanan Date of Service: 06/10/2017 9:30 AM Medical Record Number: [id number] Patient Account Number: [id number] Date of Birth/Sex: 1932/06/25 (84 y.o. F) Treating RN: Huel Coventry Primary Care Simran Bomkamp: PATIENT, NO Other Clinician: Referring Whitleigh Garramone: Franchot Mimes Treating Cuyler Vandyken/Extender: Kathreen Cosier in Treatment: 4 Wound Status Wound Number: 1 Primary Etiology: Venous Leg Ulcer Wound Location: Right, Lateral Lower Leg Wound Status: Open Wounding Event: Gradually Appeared Date Acquired: 03/29/2017 Weeks Of Treatment: 4 Clustered Wound: No Photos Photo Uploaded By: Elliot Gurney, BSN, RN, CWS, Kim on 06/11/2017 17:14:14 Wound Measurements Length: (cm) 0.5 Width: (cm) 0.4 Depth: (cm) 0.2 Area: (cm) 0.157 Volume: (cm) 0.031 % Reduction in Area: 58.4% % Reduction in Volume: 18.4% Wound Description Full Thickness Without Exposed Support Classification: Structures Periwound Skin Texture Texture Color No Abnormalities Noted: No No Abnormalities Noted: No Moisture No Abnormalities Noted: No Treatment Notes Wound #1 (Right, Lateral Lower Leg) 1. Cleansed with: Clean wound with Normal Saline 2. Anesthetic Topical Lidocaine 4% cream to wound bed prior to debridement [facility], [place] ([id number]) 3. Peri-wound Care: Moisturizing lotion 4. Dressing Applied: Hydrafera Blue 5. Secondary Dressing Applied ABD Pad 7. Secured with 3 Layer Compression System - Bilateral Electronic Signature(s) Signed: 06/10/2017 5:52:53 PM By: Elliot Gurney, BSN,  RN, CWS, Kim RN, BSN Entered By: Elliot Gurney, BSN, RN, CWS, Kim on 06/10/2017 09:42:29 Lydia Buchanan ([id number]) -------------------------------------------------------------------------------- Wound Assessment Details Patient Name: Lydia Buchanan Date of Service: 06/10/2017 9:30 AM Medical Record Number: [id number] Patient Account Number: [id number] Date of Birth/Sex: 09/19/1932 (84 y.o. F) Treating RN: Huel Coventry Primary Care Sreya Froio: PATIENT, NO Other Clinician: Referring Emanuel Dowson: Franchot Mimes Treating Traeger Sultana/Extender: Kathreen Cosier in Treatment: 4 Wound Status Wound Number: 2 Primary Etiology: Venous Leg Ulcer Wound Location: Right, Posterior Lower Leg Wound  Status: Open Wounding Event: Gradually Appeared Date Acquired: 03/29/2017 Weeks Of Treatment: 4 Clustered Wound: No Photos Photo Uploaded By: Elliot Gurney, BSN, RN, CWS, Kim on 06/11/2017 17:14:14 Wound Measurements Length: (cm) 0.9 Width: (cm) 0.7 Depth: (cm) 0.2 Area: (cm) 0.495 Volume: (cm) 0.099 % Reduction in Area: 86.3% % Reduction in Volume: 72.6% Wound Description Full Thickness Without Exposed Support Classification: Structures Periwound Skin Texture Texture Color No Abnormalities Noted: No No Abnormalities Noted: No Moisture No Abnormalities Noted: No Treatment Notes Wound #2 (Right, Posterior Lower Leg) 1. Cleansed with: Clean wound with Normal Saline 2. Anesthetic Topical Lidocaine 4% cream to wound bed prior to debridement [facility], [place] ([id number]) 3. Peri-wound Care: Moisturizing lotion 4. Dressing Applied: Hydrafera Blue 5. Secondary Dressing Applied ABD Pad 7. Secured with 3 Layer Compression System - Bilateral Electronic Signature(s) Signed: 06/10/2017 5:52:53 PM By: Elliot Gurney, BSN, RN, CWS, Kim RN, BSN Entered By: Elliot Gurney, BSN, RN, CWS, Kim on 06/10/2017 09:42:30 Lydia Buchanan ([id number]) -------------------------------------------------------------------------------- Wound  Assessment Details Patient Name: Lydia Buchanan Date of Service: 06/10/2017 9:30 AM Medical Record Number: [id number] Patient Account Number: [id number] Date of Birth/Sex: Sep 04, 1932 (84 y.o. F) Treating RN: Huel Coventry Primary Care Yuritzi Kamp: PATIENT, NO Other Clinician: Referring Nasim Habeeb: Franchot Mimes Treating Sherrise Liberto/Extender: Kathreen Cosier in Treatment: 4 Wound Status Wound Number: 3 Primary Etiology: Venous Leg Ulcer Wound Location: Left, Anterior Lower Leg Wound Status: Open Wounding Event: Gradually Appeared Date Acquired: 03/29/2017 Weeks Of Treatment: 4 Clustered Wound: No Photos Photo Uploaded By: Elliot Gurney, BSN, RN, CWS, Kim on 06/11/2017 17:14:44 Wound Measurements Length: (cm) 0.2 Width: (cm) 0.2 Depth: (cm) 0.1 Area: (cm) 0.031 Volume: (cm) 0.003 % Reduction in Area: 98.2% % Reduction in Volume: 98.2% Wound Description Full Thickness Without Exposed Support Classification: Structures Periwound Skin Texture Texture Color No Abnormalities Noted: No No Abnormalities Noted: No Moisture No Abnormalities Noted: No Treatment Notes Wound #3 (Left, Anterior Lower Leg) 1. Cleansed with: Clean wound with Normal Saline 2. Anesthetic Topical Lidocaine 4% cream to wound bed prior to debridement [facility], [place] ([id number]) 3. Peri-wound Care: Moisturizing lotion 4. Dressing Applied: Hydrafera Blue 5. Secondary Dressing Applied ABD Pad 7. Secured with 3 Layer Compression System - Bilateral Electronic Signature(s) Signed: 06/10/2017 5:52:53 PM By: Elliot Gurney, BSN, RN, CWS, Kim RN, BSN Entered By: Elliot Gurney, BSN, RN, CWS, Kim on 06/10/2017 09:42:30 Lydia Buchanan ([id number]) -------------------------------------------------------------------------------- Vitals Details Patient Name: Lydia Buchanan Date of Service: 06/10/2017 9:30 AM Medical Record Number: [id number] Patient Account Number: [id number] Date of Birth/Sex: 04-Sep-1932 (84 y.o. F) Treating RN:  Huel Coventry Primary Care Dealie Koelzer: PATIENT, NO Other Clinician: Referring Neeko Pharo: Franchot Mimes Treating Silas Muff/Extender: Kathreen Cosier in Treatment: 4 Vital Signs Time Taken: 09:30 Temperature (F): 98.0 Height (in): 62 Pulse (bpm): 106 Weight (lbs): 112 Respiratory Rate (breaths/min): 18 Body Mass Index (BMI): 20.5 Blood Pressure (mmHg): 130/90 Reference Range: 80 - 120 mg / dl Notes Patient states she is nervous. Electronic Signature(s) Signed: 06/10/2017 5:52:53 PM By: Elliot Gurney, BSN, RN, CWS, Kim RN, BSN Entered By: Elliot Gurney, BSN, RN, CWS, Kim on 06/10/2017 09:30:44

## 2017-06-15 ENCOUNTER — Other Ambulatory Visit: Payer: Self-pay

## 2017-06-15 ENCOUNTER — Ambulatory Visit (INDEPENDENT_AMBULATORY_CARE_PROVIDER_SITE_OTHER): Payer: Medicare Other | Admitting: Psychiatry

## 2017-06-15 ENCOUNTER — Encounter: Payer: Self-pay | Admitting: Psychiatry

## 2017-06-15 VITALS — BP 116/74 | HR 94 | Temp 97.8°F | Wt 105.4 lb

## 2017-06-15 DIAGNOSIS — F5105 Insomnia due to other mental disorder: Secondary | ICD-10-CM | POA: Diagnosis not present

## 2017-06-15 DIAGNOSIS — F410 Panic disorder [episodic paroxysmal anxiety] without agoraphobia: Secondary | ICD-10-CM

## 2017-06-15 DIAGNOSIS — F411 Generalized anxiety disorder: Secondary | ICD-10-CM | POA: Diagnosis not present

## 2017-06-15 MED ORDER — OLANZAPINE 2.5 MG PO TABS: 2.5000 mg | ORAL_TABLET | Freq: Every day | ORAL | 1 refills | Status: DC
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
## 2017-06-15 NOTE — Patient Instructions (Signed)
Olanzapine tablets What is this medicine? OLANZAPINE (oh LAN za peen) is used to treat schizophrenia, psychotic disorders, and bipolar disorder. Bipolar disorder is also known as manic-depression. This medicine may be used for other purposes; ask your health care provider or pharmacist if you have questions. COMMON BRAND NAME(S): Zyprexa What should I tell my health care provider before I take this medicine? They need to know if you have any of these conditions: -breast cancer or history of breast cancer -cigarette smoker -dementia -diabetes mellitus, high blood sugar or a family history of diabetes -difficulty swallowing -glaucoma -heart disease, irregular heartbeat, or previous heart attack -history of brain tumor or head injury -kidney or liver disease -low blood pressure or dizziness when standing up -Parkinson's disease -prostate trouble -seizures (convulsions) -suicidal thoughts, plans, or attempt by you or a family member -an unusual or allergic reaction to olanzapine, other medicines, foods, dyes, or preservatives -pregnant or trying to get pregnant -breast-feeding How should I use this medicine? Take this medicine by mouth. Swallow it with a drink of water. Follow the directions on the prescription label. Take your medicine at regular intervals. Do not take it more often than directed. Do not stop taking except on the advice of your doctor or health care professional. A special MedGuide will be given to you by the pharmacist with each new prescription and refill. Be sure to read this information carefully each time. Talk to your pediatrician regarding the use of this medicine in children. While this drug may be prescribed for children as young as 13 years for selected conditions, precautions do apply. Overdosage: If you think you have taken too much of this medicine contact a poison control center or emergency room at once. NOTE: This medicine is only for you. Do not share this  medicine with others. What if I miss a dose? If you miss a dose, take it as soon as you can. If it is almost time for your next dose, take only that dose. Do not take double or extra doses. What may interact with this medicine? Do not take this medicine with any of the following medications: -certain antibiotics like grepafloxacin and sparfloxacin -certain phenothiazines like chlorpromazine, mesoridazine, and thioridazine -cisapride -clozapine -droperidol -halofantrine -levomethadyl -pimozide This medicine may also interact with the following medications: -carbamazepine -charcoal -fluvoxamine -levodopa and other medicines for Parkinson's disease -medicines for diabetes -medicines for high blood pressure -medicines for mental depression, anxiety, other mood disorders, or sleeping problems -omeprazole -rifampin -ritonavir -tobacco from cigarettes This list may not describe all possible interactions. Give your health care provider a list of all the medicines, herbs, non-prescription drugs, or dietary supplements you use. Also tell them if you smoke, drink alcohol, or use illegal drugs. Some items may interact with your medicine. What should I watch for while using this medicine? Visit your doctor or health care professional for regular checks on your progress. It may be several weeks before you see the full effects of this medicine. Notify your doctor or health care professional if your symptoms get worse, if you have new symptoms, if you are having an unusual effect from this medicine, or if you feel out of control, very discouraged or think you might harm yourself or others. Do not suddenly stop taking this medicine. You may need to gradually reduce the dose. Ask your doctor or health care professional for advice. You may get dizzy or drowsy. Do not drive, use machinery, or do anything that needs mental alertness until you know   how this medicine affects you. Do not stand or sit up  quickly, especially if you are an older patient. This reduces the risk of dizzy or fainting spells. Avoid alcoholic drinks. Alcohol can increase dizziness and drowsiness with olanzapine. Do not treat yourself for colds, diarrhea or allergies without asking your doctor or health care professional for advice. Some ingredients can increase possible side effects. Your mouth may get dry. Chewing sugarless gum or sucking hard candy, and drinking plenty of water will help. This medicine can reduce the response of your body to heat or cold. Dress warm in cold weather and stay hydrated in hot weather. If possible, avoid extreme temperatures like saunas, hot tubs, very hot or cold showers, or activities that can cause dehydration such as vigorous exercise. If you notice an increased hunger or thirst, different from your normal hunger or thirst, or if you find that you have to urinate more frequently, you should contact your health care provider as soon as possible. You may need to have your blood sugar monitored. This medicine may cause changes in your blood sugar levels. You should monitor you blood sugar frequently if you have diabetes. If you smoke, tell your doctor if you notice this medicine is not working well for you. Talk to your doctor if you are a smoker or if you decide to stop smoking. What side effects may I notice from receiving this medicine? Side effects that you should report to your doctor or health care professional as soon as possible: -allergic reactions like skin rash, itching or hives, swelling of the face, lips, or tongue -breathing problems -difficulty in speaking or swallowing -excessive thirst and/or hunger -fast heartbeat (palpitations) -fever or chills, sore throat -fever with rash, swollen lymph nodes, or swelling of the face -frequently needing to urinate -inability to control muscle movements in the face, hands, arms, or legs -painful or prolonged erections -redness,  blistering, peeling or loosening of the skin, including inside the mouth -restlessness or need to keep moving -seizures (convulsions) -stiffness, spasms -tremors or trembling Side effects that usually do not require medical attention (report to your doctor or health care professional if they continue or are bothersome): -changes in sexual desire -constipation -drowsiness -lowered blood pressure This list may not describe all possible side effects. Call your doctor for medical advice about side effects. You may report side effects to FDA at 1-800-FDA-1088. Where should I keep my medicine? Keep out of the reach of children. Store at controlled room temperature between 15 and 30 degrees C (59 and 86 degrees F). Protect from light and moisture. Throw away any unused medicine after the expiration date. NOTE: This sheet is a summary. It may not cover all possible information. If you have questions about this medicine, talk to your doctor, pharmacist, or health care provider.  2018 Elsevier/Gold Standard (2014-05-22 17:33:14)  

## 2017-06-16 ENCOUNTER — Telehealth: Payer: Self-pay

## 2017-06-16 NOTE — Telephone Encounter (Signed)
received approval [facility] olanzapine 2.5mg  good until 06-16-2019

## 2017-06-16 NOTE — Telephone Encounter (Signed)
prior Berkley Harveyauth is needed on olanzapine 2.5mg 

## 2017-06-16 NOTE — Telephone Encounter (Signed)
faxed and confirmed approval letter was sent to pharmacy

## 2017-06-16 NOTE — Telephone Encounter (Signed)
went online and did the prior auth - pending

## 2017-06-17 ENCOUNTER — Encounter: Payer: Medicare Other | Attending: Nurse Practitioner | Admitting: Nurse Practitioner

## 2017-06-17 DIAGNOSIS — Z7901 Long term (current) use of anticoagulants: Secondary | ICD-10-CM | POA: Diagnosis not present

## 2017-06-17 DIAGNOSIS — Z87891 Personal history of nicotine dependence: Secondary | ICD-10-CM | POA: Insufficient documentation

## 2017-06-17 DIAGNOSIS — L97212 Non-pressure chronic ulcer of right calf with fat layer exposed: Secondary | ICD-10-CM | POA: Diagnosis present

## 2017-06-17 DIAGNOSIS — Z8673 Personal history of transient ischemic attack (TIA), and cerebral infarction without residual deficits: Secondary | ICD-10-CM | POA: Diagnosis not present

## 2017-06-17 DIAGNOSIS — I4891 Unspecified atrial fibrillation: Secondary | ICD-10-CM | POA: Diagnosis not present

## 2017-06-18 NOTE — Progress Notes (Signed)
Lydia Buchanan, Lydia Buchanan ([id number]) Visit Report for 06/17/2017 Arrival Information Details Patient Name: Lydia Buchanan, Lydia Buchanan Date of Service: 06/17/2017 9:30 AM Medical Record Number: [id number] Patient Account Number: [id number] Date of Birth/Sex: 05-30-1932 (84 y.o. F) Treating RN: Rema Jasmine Primary Care Sarkis Rhines: PATIENT, NO Other Clinician: Referring Quinta Eimer: Franchot Mimes Treating Dietrick Barris/Extender: Kathreen Cosier in Treatment: 5 Visit Information History Since Last Visit Added or deleted any medications: No Patient Arrived: Walker Any new allergies or adverse reactions: No Arrival Time: 09:11 Had a fall or experienced change in No Accompanied By: self activities of daily living that may affect Transfer Assistance: None risk of falls: Patient Identification Verified: Yes Signs or symptoms of abuse/neglect since last visito No Secondary Verification Process Completed: Yes Hospitalized since last visit: No Implantable device outside of the clinic excluding No cellular tissue based products placed in the center since last visit: Pain Present Now: No Electronic Signature(s) Signed: 06/17/2017 1:05:13 PM By: Rema Jasmine Entered By: Rema Jasmine on 06/17/2017 09:13:05 Lydia Buchanan ([id number]) -------------------------------------------------------------------------------- Encounter Discharge Information Details Patient Name: Lydia Buchanan Date of Service: 06/17/2017 9:30 AM Medical Record Number: [id number] Patient Account Number: [id number] Date of Birth/Sex: 06-03-1932 (84 y.o. F) Treating RN: Renne Crigler Primary Care Lavar Rosenzweig: PATIENT, NO Other Clinician: Referring Pierce Biagini: Franchot Mimes Treating Shaqueena Mauceri/Extender: Kathreen Cosier in Treatment: 5 Encounter Discharge Information Items Discharge Condition: Stable Ambulatory Status: Ambulatory Discharge Destination: Home Transportation: Private Auto Schedule Follow-up Appointment: Yes Clinical Summary of  Care: Electronic Signature(s) Signed: 06/17/2017 10:44:30 AM By: Renne Crigler Entered By: Renne Crigler on 06/17/2017 10:17:38 Lydia Buchanan ([id number]) -------------------------------------------------------------------------------- Lower Extremity Assessment Details Patient Name: Lydia Buchanan Date of Service: 06/17/2017 9:30 AM Medical Record Number: [id number] Patient Account Number: [id number] Date of Birth/Sex: [birth year] (84 y.o. F) Treating RN: Rema Jasmine Primary Care Nayshawn Mesta: PATIENT, NO Other Clinician: Referring Robben Jagiello: Franchot Mimes Treating Yecenia Dalgleish/Extender: Kathreen Cosier in Treatment: 5 Edema Assessment Assessed: [Left: No] [Right: No] E[Left: dema] [Right: :] Calf Left: Right: Point of Measurement: 32 cm From Medial Instep 31.5 cm 32 cm Ankle Left: Right: Point of Measurement: 10 cm From Medial Instep 20 cm 19.5 cm Vascular Assessment Pulses: Dorsalis Pedis Palpable: [Left:Yes] [Right:Yes] Posterior Tibial Extremity colors, hair growth, and conditions: Extremity Color: [Left:Hyperpigmented] [Right:Hyperpigmented] Hair Growth on Extremity: [Left:No] [Right:No] Temperature of Extremity: [Left:Warm] [Right:Warm] Capillary Refill: [Left:< 3 seconds] [Right:< 3 seconds] Toe Nail Assessment Left: Right: Thick: Yes Yes Discolored: Yes Yes Deformed: Yes Yes Improper Length and Hygiene: No No Electronic Signature(s) Signed: 06/17/2017 1:05:13 PM By: Rema Jasmine Entered By: Rema Jasmine on 06/17/2017 09:28:41 Lydia Buchanan ([id number]) -------------------------------------------------------------------------------- Multi Wound Chart Details Patient Name: Lydia Buchanan Date of Service: 06/17/2017 9:30 AM Medical Record Number: [id number] Patient Account Number: [id number] Date of Birth/Sex: 06-Oct-1932 (84 y.o. F) Treating RN: Phillis Haggis Primary Care Braulio Kiedrowski: PATIENT, NO Other Clinician: Referring Trisha Morandi: Franchot Mimes Treating  Yuliza Cara/Extender: Kathreen Cosier in Treatment: 5 Vital Signs Height(in): 62 Pulse(bpm): 99 Weight(lbs): 112 Blood Pressure(mmHg): 159/85 Body Mass Index(BMI): 20 Temperature(F): 98.1 Respiratory Rate 18 (breaths/min): Photos: [1:No Photos] [2:No Photos] [3:No Photos] Wound Location: [1:Right, Lateral Lower Leg] [2:Right Lower Leg - Posterior] [3:Left, Anterior Lower Leg] Wounding Event: [1:Gradually Appeared] [2:Gradually Appeared] [3:Gradually Appeared] Primary Etiology: [1:Venous Leg Ulcer] [2:Venous Leg Ulcer] [3:Venous Leg Ulcer] Comorbid History: [1:Cataracts, Arrhythmia, Congestive Heart Failure, Hypertension] [2:Cataracts, Arrhythmia, Congestive Heart Failure, Hypertension] [3:N/A] Date Acquired: [1:03/29/2017] [2:03/29/2017] [3:03/29/2017] Weeks of Treatment: [1:5] [2:5] [3:5] Wound Status: [1:Healed - Epithelialized] [2:Open] [3:Open] Measurements L x W x D [1:0x0x0] [2:0.6x0.3x0.1] [3:0x0x0] (cm)  Area (cm) : [1:0] [2:0.141] [3:0] Volume (cm) : [1:0] [2:0.014] [3:0] % Reduction in Area: [1:100.00%] [2:96.10%] [3:100.00%] % Reduction in Volume: [1:100.00%] [2:96.10%] [3:100.00%] Classification: [1:Partial Thickness] [2:Partial Thickness] [3:Full Thickness Without Exposed Support Structures] Exudate Amount: [1:None Present] [2:None Present] [3:N/A] Wound Margin: [1:Flat and Intact] [2:N/A] [3:N/A] Granulation Amount: [1:Small (1-33%)] [2:Small (1-33%)] [3:N/A] Necrotic Amount: [1:None Present (0%)] [2:None Present (0%)] [3:N/A] Exposed Structures: [1:Fascia: No Fat Layer (Subcutaneous Tissue) Exposed: No Tendon: No Muscle: No Joint: No Bone: No] [2:Fascia: No Fat Layer (Subcutaneous Tissue) Exposed: No Tendon: No Muscle: No Joint: No Bone: No] [3:N/A] Debridement: [1:N/A] [2:Debridement - Excisional] [3:N/A] Pre-procedure [1:N/A] [2:09:41] [3:N/A] Verification/Time Out Taken: Pain Control: [1:N/A] [2:Lidocaine 4% Topical Solution] [3:N/A] Tissue Debrided: [1:N/A]  [2:Subcutaneous, Slough] [3:N/A] Level: [1:N/A] [2:Skin/Subcutaneous Tissue] [3:N/A] Debridement Area (sq cm): [1:N/A] [2:0.18] [3:N/A] Instrument: [1:N/A] [2:Curette] [3:N/A] Bleeding: N/A Minimum N/A Hemostasis Achieved: N/A Pressure N/A Procedural Pain: N/A 0 N/A Post Procedural Pain: N/A 0 N/A Debridement Treatment N/A Procedure was tolerated well N/A Response: Post Debridement N/A 0.6x0.3x0.2 N/A Measurements L x W x D (cm) Post Debridement Volume: N/A 0.028 N/A (cm) Periwound Skin Texture: No Abnormalities Noted Excoriation: No No Abnormalities Noted Induration: No Callus: No Crepitus: No Rash: No Scarring: No Periwound Skin Moisture: No Abnormalities Noted Maceration: No No Abnormalities Noted Dry/Scaly: No Periwound Skin Color: No Abnormalities Noted Atrophie Blanche: No No Abnormalities Noted Cyanosis: No Ecchymosis: No Erythema: No Hemosiderin Staining: No Mottled: No Pallor: No Rubor: No Tenderness on Palpation: No No No Wound Preparation: Ulcer Cleansing: Ulcer Cleansing: Other: normal N/A Rinsed/Irrigated with Saline saline Procedures Performed: N/A Debridement N/A Treatment Notes Wound #2 (Right, Posterior Lower Leg) 1. Cleansed with: Clean wound with Normal Saline 3. Peri-wound Care: Moisturizing lotion 4. Dressing Applied: Mepitel 7. Secured with 3 Layer Compression System - Bilateral Electronic Signature(s) Signed: 06/17/2017 10:35:47 AM By: Bonnell Public Entered By: Bonnell Public on 06/17/2017 10:35:47 Lydia Buchanan ([id number]) -------------------------------------------------------------------------------- Multi-Disciplinary Care Plan Details Patient Name: Lydia Buchanan Date of Service: 06/17/2017 9:30 AM Medical Record Number: [id number] Patient Account Number: [id number] Date of Birth/Sex: [birth year] (84 y.o. F) Treating RN: Phillis Haggis Primary Care Alphia Behanna: PATIENT, NO Other Clinician: Referring Vertis Scheib: Franchot Mimes Treating Adeleigh Barletta/Extender: Kathreen Cosier in Treatment: 5 Active Inactive ` Abuse / Safety / Falls / Self Care Management Nursing Diagnoses: Potential for falls Goals: Patient will not experience any injury related to falls Date Initiated: 05/13/2017 Target Resolution Date: 08/21/2017 Goal Status: Active Interventions: Assess Activities of Daily Living upon admission and as needed Assess fall risk on admission and as needed Assess: immobility, friction, shearing, incontinence upon admission and as needed Assess impairment of mobility on admission and as needed per policy Assess personal safety and home safety (as indicated) on admission and as needed Assess self care needs on admission and as needed Notes: ` Nutrition Nursing Diagnoses: Imbalanced nutrition Potential for alteratiion in Nutrition/Potential for imbalanced nutrition Goals: Patient/caregiver agrees to and verbalizes understanding of need to use nutritional supplements and/or vitamins as prescribed Date Initiated: 05/13/2017 Target Resolution Date: 09/18/2017 Goal Status: Active Interventions: Assess patient nutrition upon admission and as needed per policy Notes: ` Orientation to the Wound Care Program Nursing Diagnoses: Knowledge deficit related to the wound healing center program Lydia Buchanan, Lydia Buchanan ([id number]) Goals: Patient/caregiver will verbalize understanding of the Wound Healing Center Program Date Initiated: 05/13/2017 Target Resolution Date: 06/19/2017 Goal Status: Active Interventions: Provide education on orientation to the wound center Notes: ` Pain, Acute or Chronic Nursing Diagnoses: Pain, acute  or chronic: actual or potential Potential alteration in comfort, pain Goals: Patient/caregiver will verbalize adequate pain control between visits Date Initiated: 05/13/2017 Target Resolution Date: 09/18/2017 Goal Status: Active Interventions: Complete pain assessment as per visit  requirements Notes: ` Wound/Skin Impairment Nursing Diagnoses: Impaired tissue integrity Knowledge deficit related to ulceration/compromised skin integrity Goals: Ulcer/skin breakdown will have a volume reduction of 80% by week 12 Date Initiated: 05/13/2017 Target Resolution Date: 09/11/2017 Goal Status: Active Interventions: Assess patient/caregiver ability to perform ulcer/skin care regimen upon admission and as needed Assess ulceration(s) every visit Notes: Electronic Signature(s) Signed: 06/17/2017 2:43:37 PM By: Alejandro Mulling Entered By: Alejandro Mulling on 06/17/2017 09:42:02 Lydia Buchanan ([id number]) -------------------------------------------------------------------------------- Pain Assessment Details Patient Name: Lydia Buchanan Date of Service: 06/17/2017 9:30 AM Medical Record Number: [id number] Patient Account Number: [id number] Date of Birth/Sex: 11-May-1932 (84 y.o. F) Treating RN: Rema Jasmine Primary Care Siya Flurry: PATIENT, NO Other Clinician: Referring Harlo Fabela: Franchot Mimes Treating Zamyia Gowell/Extender: Kathreen Cosier in Treatment: 5 Active Problems Location of Pain Severity and Description of Pain Patient Has Paino No Site Locations Pain Management and Medication Current Pain Management: Goals for Pain Management Topical or injectable lidocaine is offered to patient for acute pain when surgical debridement is performed. If needed, Patient is instructed to use over the counter pain medication for the following 24-48 hours after debridement. Wound care MDs do not prescribed pain medications. Patient has chronic pain or uncontrolled pain. Patient has been instructed to make an appointment with their Primary Care Physician for pain management. Electronic Signature(s) Signed: 06/17/2017 1:05:13 PM By: Rema Jasmine Entered By: Rema Jasmine on 06/17/2017 09:13:48 Lydia Buchanan  ([id number]) -------------------------------------------------------------------------------- Patient/Caregiver Education Details Patient Name: Lydia Buchanan Date of Service: 06/17/2017 9:30 AM Medical Record Number: [id number] Patient Account Number: [id number] Date of Birth/Gender: [birth year] (84 y.o. F) Treating RN: Renne Crigler Primary Care Physician: PATIENT, NO Other Clinician: Referring Physician: Franchot Mimes Treating Physician/Extender: Kathreen Cosier in Treatment: 5 Education Assessment Education Provided To: Patient Education Topics Provided Wound/Skin Impairment: Handouts: Caring for Your Ulcer Methods: Explain/Verbal Responses: State content correctly Electronic Signature(s) Signed: 06/17/2017 10:44:30 AM By: Renne Crigler Entered By: Renne Crigler on 06/17/2017 10:18:07 Lydia Buchanan ([id number]) -------------------------------------------------------------------------------- Wound Assessment Details Patient Name: Lydia Buchanan Date of Service: 06/17/2017 9:30 AM Medical Record Number: [id number] Patient Account Number: [id number] Date of Birth/Sex: 1932/11/13 (84 y.o. F) Treating RN: Phillis Haggis Primary Care Joshlynn Alfonzo: PATIENT, NO Other Clinician: Referring [place] Fassnacht: Franchot Mimes Treating Rowyn Mustapha/Extender: Kathreen Cosier in Treatment: 5 Wound Status Wound Number: 1 Primary Venous Leg Ulcer Etiology: Wound Location: Right, Lateral Lower Leg Wound Status: Healed - Epithelialized Wounding Event: Gradually Appeared Comorbid Cataracts, Arrhythmia, Congestive Heart Date Acquired: 03/29/2017 History: Failure, Hypertension Weeks Of Treatment: 5 Clustered Wound: No Photos Photo Uploaded By: Renne Crigler on 06/17/2017 10:43:23 Wound Measurements Length: (cm) 0 Width: (cm) 0 Depth: (cm) 0 Area: (cm) 0 Volume: (cm) 0 % Reduction in Area: 100% % Reduction in Volume: 100% Tunneling: No Undermining: No Wound  Description Classification: Partial Thickness Wound Margin: Flat and Intact Exudate Amount: None Present Foul Odor After Cleansing: No Slough/Fibrino No Wound Bed Granulation Amount: Small (1-33%) Exposed Structure Necrotic Amount: None Present (0%) Fascia Exposed: No Fat Layer (Subcutaneous Tissue) Exposed: No Tendon Exposed: No Muscle Exposed: No Joint Exposed: No Bone Exposed: No Periwound Skin Texture Texture Color No Abnormalities Noted: No No Abnormalities Noted: No Panning, [place] ([id number]) Moisture No Abnormalities Noted: No Wound Preparation Ulcer Cleansing: Rinsed/Irrigated with Saline Electronic Signature(s) Signed: 06/17/2017 2:43:37 PM By: Ashok Cordia,  Debra Entered By: Alejandro MullingPinkerton, Debra on 06/17/2017 09:41:54 Lydia LoronAMANN, Ravynn ([id number]) -------------------------------------------------------------------------------- Wound Assessment Details Patient Name: Lydia LoronRAMANN, Cloma Date of Service: 06/17/2017 9:30 AM Medical Record Number: [id number] Patient Account Number: [id number] Date of Birth/Sex: 09/20/1932 (84 y.o. F) Treating RN: Rema JasmineNg, Wendi Primary Care Mardella Nuckles: PATIENT, NO Other Clinician: Referring Mikenzi Raysor: Franchot MimesBISSELL, BRAD Treating Melaina Howerton/Extender: Kathreen Cosieroulter, Leah Weeks in Treatment: 5 Wound Status Wound Number: 2 Primary Venous Leg Ulcer Etiology: Wound Location: Right Lower Leg - Posterior Wound Status: Open Wounding Event: Gradually Appeared Comorbid Cataracts, Arrhythmia, Congestive Heart Date Acquired: 03/29/2017 History: Failure, Hypertension Weeks Of Treatment: 5 Clustered Wound: No Photos Photo Uploaded By: Renne CriglerFlinchum, Cheryl on 06/17/2017 10:43:40 Wound Measurements Length: (cm) 0.6 Width: (cm) 0.3 Depth: (cm) 0.1 Area: (cm) 0.141 Volume: (cm) 0.014 % Reduction in Area: 96.1% % Reduction in Volume: 96.1% Tunneling: No Undermining: No Wound Description Classification: Partial Thickness Exudate Amount: None Present Foul Odor After  Cleansing: No Slough/Fibrino No Wound Bed Granulation Amount: Small (1-33%) Exposed Structure Necrotic Amount: None Present (0%) Fascia Exposed: No Fat Layer (Subcutaneous Tissue) Exposed: No Tendon Exposed: No Muscle Exposed: No Joint Exposed: No Bone Exposed: No Periwound Skin Texture Texture Color No Abnormalities Noted: Yes No Abnormalities Noted: No Taniguchi, Nayomi ([id number]) Moisture Atrophie Blanche: No No Abnormalities Noted: No Cyanosis: No Dry / Scaly: No Ecchymosis: No Maceration: No Erythema: No Hemosiderin Staining: No Mottled: No Pallor: No Rubor: No Wound Preparation Ulcer Cleansing: Other: normal saline, Treatment Notes Wound #2 (Right, Posterior Lower Leg) 1. Cleansed with: Clean wound with Normal Saline 3. Peri-wound Care: Moisturizing lotion 4. Dressing Applied: Mepitel 7. Secured with 3 Layer Compression System - Bilateral Electronic Signature(s) Signed: 06/17/2017 1:05:13 PM By: Rema JasmineNg, Wendi Entered By: Rema JasmineNg, Wendi on 06/17/2017 09:35:02 Lydia LoronAMANN, Shaily ([id number]) -------------------------------------------------------------------------------- Wound Assessment Details Patient Name: Lydia LoronAMANN, Hollan Date of Service: 06/17/2017 9:30 AM Medical Record Number: [id number] Patient Account Number: [id number] Date of Birth/Sex: 01/12/1933 (84 y.o. F) Treating RN: Rema JasmineNg, Wendi Primary Care Gus Littler: PATIENT, NO Other Clinician: Referring Keina Mutch: Franchot MimesBISSELL, BRAD Treating Nikkole Placzek/Extender: Kathreen Cosieroulter, Leah Weeks in Treatment: 5 Wound Status Wound Number: 3 Primary Etiology: Venous Leg Ulcer Wound Location: Left, Anterior Lower Leg Wound Status: Open Wounding Event: Gradually Appeared Date Acquired: 03/29/2017 Weeks Of Treatment: 5 Clustered Wound: No Photos Photo Uploaded By: Renne CriglerFlinchum, Cheryl on 06/17/2017 10:43:56 Wound Measurements Length: (cm) 0 % Width: (cm) 0 % Depth: (cm) 0 Area: (cm) 0 Volume: (cm) 0 Reduction in Area: 100% Reduction  in Volume: 100% Wound Description Full Thickness Without Exposed Support Classification: Structures Periwound Skin Texture Texture Color No Abnormalities Noted: No No Abnormalities Noted: No Moisture No Abnormalities Noted: No Electronic Signature(s) Signed: 06/17/2017 1:05:13 PM By: Rema JasmineNg, Wendi Entered By: Rema JasmineNg, Wendi on 06/17/2017 24:40:1009:26:22 Lydia LoronAMANN, Frankee ([id number]) -------------------------------------------------------------------------------- Vitals Details Patient Name: Lydia LoronAMANN, Shynia Date of Service: 06/17/2017 9:30 AM Medical Record Number: [id number] Patient Account Number: [id number] Date of Birth/Sex: 04/07/1932 (84 y.o. F) Treating RN: Rema JasmineNg, Wendi Primary Care Jazion Atteberry: PATIENT, NO Other Clinician: Referring Malijah Lietz: Franchot MimesBISSELL, BRAD Treating Shanie Mauzy/Extender: Kathreen Cosieroulter, Leah Weeks in Treatment: 5 Vital Signs Time Taken: 09:14 Temperature (F): 98.1 Height (in): 62 Pulse (bpm): 99 Weight (lbs): 112 Respiratory Rate (breaths/min): 18 Body Mass Index (BMI): 20.5 Blood Pressure (mmHg): 159/85 Reference Range: 80 - 120 mg / dl Electronic Signature(s) Signed: 06/17/2017 1:05:13 PM By: Rema JasmineNg, Wendi Entered ByRema Jasmine: Ng, Wendi on 06/17/2017 09:14:44

## 2017-06-18 NOTE — Progress Notes (Addendum)
TENA, LINEBAUGH ([id number]) Visit Report for 06/17/2017 Chief Complaint Document Details Patient Name: Lydia Buchanan, Lydia Buchanan Date of Service: 06/17/2017 9:30 AM Medical Record Number: [id number] Patient Account Number: [id number] Date of Birth/Sex: [birth year] (84 y.o. F) Treating RN: Phillis Haggis Primary Care Provider: PATIENT, NO Other Clinician: Referring Provider: Franchot Mimes Treating Provider/Extender: Kathreen Cosier in Treatment: 5 Information Obtained from: Patient Chief Complaint She is here for evaluation of bilateral lower extremity ulcers Electronic Signature(s) Signed: 06/17/2017 10:36:30 AM By: Bonnell Public Entered By: Bonnell Public on 06/17/2017 10:36:29 Lydia Buchanan ([id number]) -------------------------------------------------------------------------------- Debridement Details Patient Name: Lydia Buchanan Date of Service: 06/17/2017 9:30 AM Medical Record Number: [id number] Patient Account Number: [id number] Date of Birth/Sex: 04-22-1932 (84 y.o. F) Treating RN: Phillis Haggis Primary Care Provider: PATIENT, NO Other Clinician: Referring Provider: Franchot Mimes Treating Provider/Extender: Kathreen Cosier in Treatment: 5 Debridement Performed for Wound #2 Right,Posterior Lower Leg Assessment: Performed By: Physician Bonnell Public, NP Debridement Type: Debridement Severity of Tissue Pre Limited to breakdown of skin Debridement: Pre-procedure Verification/Time Yes - 09:41 Out Taken: Start Time: 09:41 Pain Control: Lidocaine 4% Topical Solution Total Area Debrided (L x W): 0.6 (cm) x 0.3 (cm) = 0.18 (cm) Tissue and other material Viable, Non-Viable, Skin: Dermis , Skin: Epidermis, Fibrin/Exudate debrided: Level: Skin/Epidermis Debridement Description: Selective/Open Wound Instrument: Curette Bleeding: Minimum Hemostasis Achieved: Pressure End Time: 09:44 Procedural Pain: 0 Post Procedural Pain: 0 Response to Treatment: Procedure was tolerated  well Level of Consciousness: Awake and Alert Post Procedure Vitals: Temperature: 98.1 Pulse: 99 Respiratory Rate: 18 Blood Pressure: Systolic Blood Pressure: 159 Diastolic Blood Pressure: 85 Post Debridement Measurements of Total Wound Length: (cm) 0.6 Width: (cm) 0.3 Depth: (cm) 0.2 Volume: (cm) 0.028 Character of Wound/Ulcer Post Debridement: Requires Further Debridement Severity of Tissue Post Debridement: Limited to breakdown of skin Post Procedure Diagnosis Same as Pre-procedure Electronic Signature(s) Signed: 06/17/2017 10:36:20 AM By: Bonnell Public Signed: 06/17/2017 2:43:37 PM By: Orson Eva, Poet ([id number]) Entered By: Bonnell Public on 06/17/2017 10:36:20 Lydia Buchanan ([id number]) -------------------------------------------------------------------------------- HPI Details Patient Name: Lydia Buchanan Date of Service: 06/17/2017 9:30 AM Medical Record Number: [id number] Patient Account Number: [id number] Date of Birth/Sex: Nov 28, 1932 (84 y.o. F) Treating RN: Phillis Haggis Primary Care Provider: PATIENT, NO Other Clinician: Referring Provider: Franchot Mimes Treating Provider/Extender: Kathreen Cosier in Treatment: 5 History of Present Illness HPI Description: 05/13/17-She is here for initial evaluation of bilateral location big ulcers. She recently moved here from [place], resides at [facility]. She states that approximately 6-8 weeks ago she developed bilateral lower extremity edema with blisters which resulted in ulcerations. She has been going to [place] [facility] and was being treated with Xeroform and thigh-high TED hose compression. She was on Eliquis for atrial fibrillation, but that was DC'd approximately 3 weeks ago by cardiology when she was hospitalized after uncontrolled bleeding from the left lower extremity ulcer. She is extremely anxious, nervous and would prefer no debridement at today's appointment; repeating  questions. We will initiate home health. She is non-diabetic and a former smoker, quit  10 years ago. According to Epic records, she has been to the emergency department twice since arrival to [facility]; visit on 4/28 for BLE pain a DVT study was done and negative on 4/30 visit her daughter requested a psych evaluation r/t manipulative behavior and extreme anxiety. 05/20/17 on evaluation today patient's wounds actually show signs of being a little bit better compared to last week's evaluation that I was not the provider see her at that point. She does  have some Slough noted on the surface of the wound beds but fortunately there does not appear to be any evidence of significant infection. No fevers, chills, nausea, or vomiting noted at this time. Patient does appear to be extremely anxious. 05/27/17-She is here in follow-up evaluation for bilateral lower extremity ulcers. She is extremely anxious. It is noted that last week there was bleeding complications with the left lower extremity ulcer, there is residual chemical cautery effect. We will defer debridement, overall there is improvement to these ulcers. We will continue with Iodosorb/Iodoflex and 3 layer compression wrap. 06/03/17-She is here in follow up evaluation for bilateral lower extremity ulcers. Home health was then able to evaluate on Monday; she was seen in the emergency room for anxiety/panic attack and taken home Tuesday evening. There has been improvement in ulceration since last evaluation and we will continue with the same treatment plan she will follow-up next week. 06/10/17- She is here in follow up evaluation for bilateral lower extremity ulcers. The left leg is almost healed; right leg is improved. She remains extremely anxious but did take the [place] today, which is an improvement. She is allowing home health to change her dressings; we will change dressings and she will follow up next week 06/17/17-She is here in follow-up  evaluation for bilateral lower extremity ulcers. The left lower extremity is epithelialized, the right anterior ulcer is epithelialized, the right posterior ulcer is partial-thickness. We will continue with compression therapy; home health can remove left leg compression therapy on Monday. She will follow-up next week. She remains quite anxious but did come to clinic with a "friend" on the facility bus. Electronic Signature(s) Signed: 06/20/2017 2:53:04 PM By: Bonnell Public Previous Signature: 06/17/2017 10:37:28 AM Version By: Bonnell Public Entered By: Bonnell Public on 06/20/2017 14:53:04 Lydia Buchanan ([id number]) -------------------------------------------------------------------------------- Physician Orders Details Patient Name: Lydia Buchanan Date of Service: 06/17/2017 9:30 AM Medical Record Number: [id number] Patient Account Number: [id number] Date of Birth/Sex: 1932-09-08 (84 y.o. F) Treating RN: Phillis Haggis Primary Care Provider: PATIENT, NO Other Clinician: Referring Provider: Franchot Mimes Treating Provider/Extender: Kathreen Cosier in Treatment: 5 Verbal / Phone Orders: Yes Clinician: Pinkerton, Debi Read Back and Verified: Yes Diagnosis Coding Wound Cleansing Wound #2 Right,Posterior Lower Leg o Clean wound with Normal Saline. o Cleanse wound with mild soap and water Anesthetic (add to Medication List) Wound #2 Right,Posterior Lower Leg o Topical Lidocaine 4% cream applied to wound bed prior to debridement (In Clinic Only). Primary Wound Dressing Wound #2 Right,Posterior Lower Leg o Hydrafera Blue Ready Transfer - please soak with warm soapy water before removing from wound Secondary Dressing Wound #2 Right,Posterior Lower Leg o ABD pad Dressing Change Frequency Wound #2 Right,Posterior Lower Leg o Dressing is to be changed Monday and Thursday. - HHRN to change dressings on Mondays and pt will be seen in Wound Care Clinic on Thursdays on Monday  06/21/17 HHRN to remove wrap from left leg and leave it off but please re-wrap right leg Follow-up Appointments Wound #2 Right,Posterior Lower Leg o Return Appointment in 1 week. Edema Control Wound #2 Right,Posterior Lower Leg o 3 Layer Compression System - Bilateral - Monday 06/21/17 HHRN to remove wrap from left leg and leave it off but please re-wrap right leg o Elevate legs to the level of the heart and pump ankles as often as possible Additional Orders / Instructions Wound #2 Right,Posterior Lower Leg o Increase protein intake. Home Health Wound #2 Right,Posterior Lower Leg o Continue Home Health Visits -  no unna boots use a 3 - layer Monday 06/21/17 HHRN to remove wrap from left leg and leave it off but please re-wrap right leg Lydia Buchanan, Lydia Buchanan ([id number]) o Home Health Nurse may visit PRN to address patientos wound care needs. o FACE TO FACE ENCOUNTER: MEDICARE and MEDICAID PATIENTS: I certify that this patient is under my care and that I had a face-to-face encounter that meets the physician face-to-face encounter requirements with this patient on this date. The encounter with the patient was in whole or in part for the following MEDICAL CONDITION: (primary reason for Home Healthcare) MEDICAL NECESSITY: I certify, that based on my findings, NURSING services are a medically necessary home health service. HOME BOUND STATUS: I certify that my clinical findings support that this patient is homebound (i.e., Due to illness or injury, pt requires aid of supportive devices such as crutches, cane, wheelchairs, walkers, the use of special transportation or the assistance of another person to leave their place of residence. There is a normal inability to leave the home and doing so requires considerable and taxing effort. Other absences are for medical reasons / religious services and are infrequent or of short duration when for other reasons). o If current dressing causes  regression in wound condition, may D/C ordered dressing product/s and apply Normal Saline Moist Dressing daily until next Wound Healing Center / Other MD appointment. Notify Wound Healing Center of regression in wound condition at [phone]. o Please direct any NON-WOUND related issues/requests for orders to patient's Primary Care Physician Patient Medications Allergies: Macrobid, oxycodone Notifications Medication Indication Start End lidocaine DOSE 1 - topical 4 % cream - 1 cream topical Electronic Signature(s) Signed: 06/17/2017 2:43:37 PM By: Alejandro Mulling Signed: 06/17/2017 4:23:13 PM By: Bonnell Public Entered By: Alejandro Mulling on 06/17/2017 09:48:00 Lydia Buchanan, Lydia Buchanan ([id number]) -------------------------------------------------------------------------------- Prescription 06/17/2017 Patient Name: Lydia Buchanan Provider: Bonnell Public NP Date of Birth: 07-17-1932 NPI#: [id number] Sex: F DEA#: [id number] Phone #: [phone] License #: Patient Address: [facility] [address] [facility] [address], [address] [place], [place] [address] [place], [place] [address] [phone] Allergies Macrobid oxycodone Medication Medication: Route: Strength: Form: lidocaine 4 % topical cream topical 4% cream Class: TOPICAL LOCAL ANESTHETICS Dose: Frequency / Time: Indication: 1 1 cream topical Number of Refills: Number of Units: 0 Generic Substitution: Start Date: End Date: One Time Use: Substitution Permitted No Note to Pharmacy: Signature(s): Date(s): Electronic Signature(s) Signed: 06/17/2017 2:43:37 PM By: Alejandro Mulling Signed: 06/17/2017 4:23:13 PM By: Bonnell Public Entered By: Alejandro Mulling on 06/17/2017 09:48:01 Lydia Buchanan ([id number]) Lydia Buchanan, Lydia Buchanan ([id number]) --------------------------------------------------------------------------------  Problem List  Details Patient Name: Lydia Buchanan Date of Service: 06/17/2017 9:30 AM Medical Record Number: [id number] Patient Account Number: [id number] Date of Birth/Sex: 1932-11-01 (84 y.o. F) Treating RN: Phillis Haggis Primary Care Provider: PATIENT, NO Other Clinician: Referring Provider: Franchot Mimes Treating Provider/Extender: Kathreen Cosier in Treatment: 5 Active Problems ICD-10 Impacting Encounter Code Description Active Date Wound Healing Diagnosis I83.893 Varicose veins of bilateral lower extremities with other 05/13/2017 No Yes complications L97.212 Non-pressure chronic ulcer of right calf with fat layer exposed 05/13/2017 No Yes L97.222 Non-pressure chronic ulcer of left calf with fat layer exposed 05/13/2017 No Yes Inactive Problems Resolved Problems Electronic Signature(s) Signed: 06/17/2017 10:35:40 AM By: Bonnell Public Entered By: Bonnell Public on 06/17/2017 10:35:40 Lydia Buchanan ([id number]) -------------------------------------------------------------------------------- Progress Note Details Patient Name: Lydia Buchanan Date of Service: 06/17/2017 9:30 AM Medical Record Number: [id number] Patient  Account Number: [id number] Date of Birth/Sex: 12-Jun-1932 (84 y.o. F) Treating RN: Phillis Haggis Primary Care Provider: PATIENT, NO Other Clinician: Referring Provider: Franchot Mimes Treating Provider/Extender: Kathreen Cosier in Treatment: 5 Subjective Chief Complaint Information obtained from Patient She is here for evaluation of bilateral lower extremity ulcers History of Present Illness (HPI) 05/13/17-She is here for initial evaluation of bilateral location big ulcers. She recently moved here from [place], resides at [facility]. She states that approximately 6-8 weeks ago she developed bilateral lower extremity edema with blisters which resulted in ulcerations. She has been going to [facility] wound [facility] and was being treated with Xeroform and  thigh-high TED hose compression. She was on Eliquis for atrial fibrillation, but that was DC'd approximately 3 weeks ago by cardiology when she was hospitalized after uncontrolled bleeding from the left lower extremity ulcer. She is extremely anxious, nervous and would prefer no debridement at today's appointment; repeating questions. We will initiate home health. She is non-diabetic and a former smoker, quit  10 years ago. According to Epic records, she has been to the emergency department twice since arrival to [facility]; visit on 4/28 for BLE pain a DVT study was done and negative on 4/30 visit her daughter requested a psych evaluation r/t manipulative behavior and extreme anxiety. 05/20/17 on evaluation today patient's wounds actually show signs of being a little bit better compared to last week's evaluation that I was not the provider see her at that point. She does have some Slough noted on the surface of the wound beds but fortunately there does not appear to be any evidence of significant infection. No fevers, chills, nausea, or vomiting noted at this time. Patient does appear to be extremely anxious. 05/27/17-She is here in follow-up evaluation for bilateral lower extremity ulcers. She is extremely anxious. It is noted that last week there was bleeding complications with the left lower extremity ulcer, there is residual chemical cautery effect. We will defer debridement, overall there is improvement to these ulcers. We will continue with Iodosorb/Iodoflex and 3 layer compression wrap. 06/03/17-She is here in follow up evaluation for bilateral lower extremity ulcers. Home health was then able to evaluate on Monday; she was seen in the emergency room for anxiety/panic attack and taken home Tuesday evening. There has been improvement in ulceration since last evaluation and we will continue with the same treatment plan she will follow-up next week. 06/10/17- She is here in follow up evaluation  for bilateral lower extremity ulcers. The left leg is almost healed; right leg is improved. She remains extremely anxious but did take the [place] today, which is an improvement. She is allowing home health to change her dressings; we will change dressings and she will follow up next week 06/17/17-She is here in follow-up evaluation for bilateral lower extremity ulcers. The left lower extremity is epithelialized, the right anterior ulcer is epithelialized, the right posterior ulcer is partial-thickness. We will continue with compression therapy; home health can remove left leg compression therapy on Monday. She will follow-up next week. She remains quite anxious but did come to clinic with a "friend" on the facility bus. Patient History Information obtained from Patient. Family History Cancer - Child, Heart Disease - Mother,Father, Hypertension - Mother,Father, No family history of Diabetes, Hereditary Spherocytosis, Kidney Disease, Lung Disease, Seizures, Stroke, Thyroid Problems, Tuberculosis. Social History Former smoker - 10 years, Marital Status - Widowed, Alcohol Use - Never, Drug Use - No History, Caffeine Use - Daily. Lydia Buchanan, Lydia Buchanan ([id number]) Medical And Surgical  History Notes Neurologic TIA Objective Constitutional Vitals Time Taken: 9:14 AM, Height: 62 in, Weight: 112 lbs, BMI: 20.5, Temperature: 98.1 F, Pulse: 99 bpm, Respiratory Rate: 18 breaths/min, Blood Pressure: 159/85 mmHg. Integumentary (Hair, Skin) Wound #1 status is Healed - Epithelialized. Original cause of wound was Gradually Appeared. The wound is located on the Right,Lateral Lower Leg. The wound measures 0cm length x 0cm width x 0cm depth; 0cm^2 area and 0cm^3 volume. There is no tunneling or undermining noted. There is a none present amount of drainage noted. The wound margin is flat and intact. There is small (1-33%) granulation within the wound bed. There is no necrotic tissue within the wound bed. Wound #2  status is Open. Original cause of wound was Gradually Appeared. The wound is located on the Right,Posterior Lower Leg. The wound measures 0.6cm length x 0.3cm width x 0.1cm depth; 0.141cm^2 area and 0.014cm^3 volume. There is no tunneling or undermining noted. There is a none present amount of drainage noted. There is small (1-33%) granulation within the wound bed. There is no necrotic tissue within the wound bed. The periwound skin appearance had no abnormalities noted for texture. The periwound skin appearance did not exhibit: Dry/Scaly, Maceration, Atrophie Blanche, Cyanosis, Ecchymosis, Hemosiderin Staining, Mottled, Pallor, Rubor, Erythema. Wound #3 status is Open. Original cause of wound was Gradually Appeared. The wound is located on the Left,Anterior Lower Leg. The wound measures 0cm length x 0cm width x 0cm depth; 0cm^2 area and 0cm^3 volume. 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 There was a Selective/Open Wound Skin/Epidermis Debridement with a total area of 0.18 sq cm performed by Bonnell Public, NP. With the following instrument(s): Curette to remove Viable and Non- Viable tissue/material. Material removed includes Skin: Dermis, Skin: Epidermis, and Fibrin/Exudate after achieving pain control using Lidocaine 4% Topical Solution. No specimens were taken. A time out was conducted at 09:41, prior to the start of the procedure. A Minimum amount of bleeding was controlled with Pressure. The procedure was tolerated well with a pain level of 0 throughout and a pain level of 0 following  the procedure. Patient s Level of Consciousness post procedure was recorded as Awake and Alert. Post Debridement Measurements: 0.6cm length x 0.3cm width x 0.2cm depth; 0.028cm^3 volume. Character of Wound/Ulcer Post Debridement requires further debridement. Severity of Tissue Post Debridement is: Limited to breakdown of skin. Post procedure Diagnosis Wound #2: Same as Pre-Procedure Plan Wound Cleansing: Wound #2 Right,Posterior Lower Leg: Clean wound with Normal Saline. Cleanse wound with mild soap and water Anesthetic (add to Medication List): Wound #2 Right,Posterior Lower Leg: Topical Lidocaine 4% cream applied to wound bed prior to debridement (In Clinic Only). Primary Wound Dressing: Wound #2 Right,Posterior Lower Leg: Hydrafera Blue Ready Transfer - please soak with warm soapy water before removing from wound Secondary Dressing: Wound #2 Right,Posterior Lower Leg: ABD pad Dressing Change Frequency: Wound #2 Right,Posterior Lower Leg: Dressing is to be changed Monday and Thursday. - HHRN to change dressings on Mondays and pt will be seen in Wound Care Clinic on Thursdays on Monday 06/21/17 HHRN to remove wrap from left leg and leave it off but please re-wrap right  leg Follow-up Appointments: Wound #2 Right,Posterior Lower Leg: Return Appointment in 1 week. Edema Control: Wound #2 Right,Posterior Lower Leg: 3 Layer Compression System - Bilateral - Monday 06/21/17 HHRN to remove wrap from left leg and leave it off but please re- wrap right leg Elevate legs to the level of the heart and pump ankles as often as possible Additional Orders / Instructions: Wound #2 Right,Posterior Lower Leg: Increase protein intake. Home Health: Wound #2 Right,Posterior Lower Leg: Continue Home Health Visits - no unna boots use a 3 - layer Monday 06/21/17 HHRN to remove wrap from left leg and leave it off but please re-wrap right leg Home Health Nurse may visit PRN to address patient s wound care  needs. FACE TO FACE ENCOUNTER: MEDICARE and MEDICAID PATIENTS: I certify that this patient is under my care and that I had a face-to-face encounter that meets the physician face-to-face encounter requirements with this patient on this date. The encounter with the patient was in whole or in part for the following MEDICAL CONDITION: (primary reason for Home Healthcare) MEDICAL NECESSITY: I certify, that based on my findings, NURSING services are a medically necessary home health service. HOME BOUND STATUS: I certify that my clinical findings support that this patient is homebound (i.e., Due to illness or injury, pt requires aid of supportive devices such as crutches, cane, wheelchairs, walkers, the use of special Lydia Buchanan, Lydia Buchanan ([id number]) transportation or the assistance of another person to leave their place of residence. There is a normal inability to leave the home and doing so requires considerable and taxing effort. Other absences are for medical reasons / religious services and are infrequent or of short duration when for other reasons). If current dressing causes regression in wound condition, may D/C ordered dressing product/s and apply Normal Saline Moist Dressing daily until next Wound Healing Center / Other MD appointment. Notify Wound Healing Center of regression in wound condition at [phone]. Please direct any NON-WOUND related issues/requests for orders to patient's Primary Care Physician The following medication(s) was prescribed: lidocaine topical 4 % cream 1 1 cream topical was prescribed at facility Electronic Signature(s) Signed: 06/20/2017 2:53:21 PM By: Bonnell Publicoulter, Bassam Dresch Previous Signature: 06/17/2017 10:37:55 AM Version By: Bonnell Publicoulter, Noely Kuhnle Entered By: Bonnell Publicoulter, Alexia Dinger on 06/20/2017 14:53:21 Lydia Buchanan, Lydia Buchanan ([id number]) -------------------------------------------------------------------------------- ROS/PFSH Details Patient Name: Lydia Buchanan, Lydia Buchanan Date of Service: 06/17/2017 9:30  AM Medical Record Number: [id number] Patient Account Number: [id number] Date of Birth/Sex: 12/02/1932 (84 y.o. F) Treating RN: Phillis HaggisPinkerton, Debi Primary Care Provider: PATIENT, NO Other Clinician: Referring Provider: Franchot MimesBISSELL, BRAD Treating Provider/Extender: Kathreen Cosieroulter, Eulas Schweitzer Weeks in Treatment: 5 Information Obtained From Patient Wound History Do you currently have one or more open woundso Yes How many open wounds do you currently haveo 2 Approximately how long have you had your woundso 6 weeks How have you been treating your wound(s) until nowo unknown Has your wound(s) ever healed and then re-openedo No Have you had any lab work done in the past montho No Have you tested positive for an antibiotic resistant organism (MRSA, VRE)o No Have you tested positive for osteomyelitis (bone infection)o No Have you had any tests for circulation on your legso No Eyes Medical History: Positive for: Cataracts - removed Negative for: Glaucoma; Optic Neuritis Ear/Nose/Mouth/Throat Medical History: Negative for: Chronic sinus problems/congestion; Middle ear problems Hematologic/Lymphatic Medical History: Negative for: Anemia; Hemophilia; Human Immunodeficiency Virus; Lymphedema; Sickle Cell Disease Respiratory Medical History: Negative for: Aspiration; Asthma; Chronic Obstructive Pulmonary Disease (COPD); Pneumothorax; Sleep Apnea; Tuberculosis Cardiovascular Medical History: Positive for: Arrhythmia -  a fib; Congestive Heart Failure; Hypertension Negative for: Angina; Coronary Artery Disease; Deep Vein Thrombosis; Hypotension; Myocardial Infarction; Peripheral Arterial Disease; Peripheral Venous Disease; Phlebitis; Vasculitis Gastrointestinal Medical History: Negative for: Cirrhosis ; Colitis; Crohnos; Hepatitis A; Hepatitis B; Hepatitis C Endocrine Lydia Buchanan, Lydia Buchanan ([id number]) Medical History: Negative for: Type I Diabetes; Type II Diabetes Genitourinary Medical History: Negative for: End  Stage Renal Disease Immunological Medical History: Negative for: Lupus Erythematosus; Raynaudos; Scleroderma Integumentary (Skin) Medical History: Negative for: History of Burn; History of pressure wounds Musculoskeletal Medical History: Negative for: Gout; Rheumatoid Arthritis; Osteoarthritis; Osteomyelitis Neurologic Medical History: Negative for: Dementia; Neuropathy; Quadriplegia; Paraplegia; Seizure Disorder Past Medical History Notes: TIA Oncologic Medical History: Negative for: Received Chemotherapy; Received Radiation Psychiatric Medical History: Negative for: Anorexia/bulimia; Confinement Anxiety HBO Extended History Items Eyes: Cataracts Immunizations Pneumococcal Vaccine: Received Pneumococcal Vaccination: Yes Implantable Devices Family and Social History Cancer: Yes - Child; Diabetes: No; Heart Disease: Yes - Mother,Father; Hereditary Spherocytosis: No; Hypertension: Yes - Mother,Father; Kidney Disease: No; Lung Disease: No; Seizures: No; Stroke: No; Thyroid Problems: No; Tuberculosis: No; Former smoker - 10 years; Marital Status - Widowed; Alcohol Use: Never; Drug Use: No History; Caffeine Use: Daily; Financial Concerns: No; Food, Clothing or Shelter Needs: No; Support System Lacking: No; Transportation Concerns: No; Advanced Directives: No; Patient does not want information on Advanced Directives Physician Affirmation I have reviewed and agree with the above information. Lydia Buchanan, Lydia Buchanan ([id number]) Electronic Signature(s) Signed: 06/17/2017 2:43:37 PM By: Alejandro Mulling Signed: 06/17/2017 4:23:13 PM By: Bonnell Public Entered By: Bonnell Public on 06/17/2017 10:37:37 Lydia Buchanan ([id number]) -------------------------------------------------------------------------------- SuperBill Details Patient Name: Lydia Buchanan Date of Service: 06/17/2017 Medical Record Number: [id number] Patient Account Number: [id number] Date of Birth/Sex: 10-08-1932 (84 y.o.  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 L97.222 Non-pressure chronic ulcer of left calf with fat layer exposed Facility Procedures CPT4 Code: 33295188 Description: 97597 - DEBRIDE WOUND 1ST 20 SQ CM OR < ICD-10 Diagnosis Description L97.212 Non-pressure chronic ulcer of right calf with fat layer expo Modifier: sed Quantity: 1 Physician Procedures CPT4 Code: 4166063 Description: 97597 - WC PHYS DEBR WO ANESTH 20 SQ CM ICD-10 Diagnosis Description L97.212 Non-pressure chronic ulcer of right calf with fat layer expo Modifier: sed Quantity: 1 Electronic Signature(s) Signed: 06/17/2017 10:38:05 AM By: Bonnell Public Entered By: Bonnell Public on 06/17/2017 10:38:04

## 2017-06-24 ENCOUNTER — Ambulatory Visit: Payer: Medicare Other | Admitting: Nurse Practitioner

## 2017-06-25 ENCOUNTER — Ambulatory Visit (INDEPENDENT_AMBULATORY_CARE_PROVIDER_SITE_OTHER): Payer: Medicare Other | Admitting: Licensed Clinical Social Worker

## 2017-06-25 ENCOUNTER — Ambulatory Visit (INDEPENDENT_AMBULATORY_CARE_PROVIDER_SITE_OTHER): Payer: Medicare Other | Admitting: Psychiatry

## 2017-06-25 ENCOUNTER — Other Ambulatory Visit: Payer: Self-pay

## 2017-06-25 ENCOUNTER — Encounter: Payer: Self-pay | Admitting: Psychiatry

## 2017-06-25 VITALS — BP 162/97 | HR 102 | Temp 98.9°F | Wt 104.0 lb

## 2017-06-25 DIAGNOSIS — F321 Major depressive disorder, single episode, moderate: Secondary | ICD-10-CM

## 2017-06-25 DIAGNOSIS — F411 Generalized anxiety disorder: Secondary | ICD-10-CM

## 2017-06-25 DIAGNOSIS — F5105 Insomnia due to other mental disorder: Secondary | ICD-10-CM | POA: Diagnosis not present

## 2017-06-25 DIAGNOSIS — F410 Panic disorder [episodic paroxysmal anxiety] without agoraphobia: Secondary | ICD-10-CM | POA: Diagnosis not present

## 2017-06-25 DIAGNOSIS — F09 Unspecified mental disorder due to known physiological condition: Secondary | ICD-10-CM | POA: Diagnosis not present

## 2017-06-25 MED ORDER — BUSPIRONE HCL 15 MG PO TABS: 15.0000 mg | ORAL_TABLET | Freq: Two times a day (BID) | ORAL | 0 refills | Status: DC
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
## 2017-07-01 ENCOUNTER — Telehealth: Payer: Self-pay

## 2017-07-01 ENCOUNTER — Encounter: Payer: Medicare Other | Admitting: Nurse Practitioner

## 2017-07-01 DIAGNOSIS — L97212 Non-pressure chronic ulcer of right calf with fat layer exposed: Secondary | ICD-10-CM | POA: Diagnosis not present

## 2017-07-01 NOTE — Telephone Encounter (Signed)
Patients son called requesting a return phone call to discuss his mom. Patients son name is Jorja Loaim [phone].

## 2017-07-02 ENCOUNTER — Ambulatory Visit: Payer: Medicare Other | Admitting: Licensed Clinical Social Worker

## 2017-07-03 NOTE — Progress Notes (Signed)
Lydia Buchanan, Dorota ([id number]) Visit Report for 07/01/2017 Arrival Information Details Patient Name: Lydia Buchanan, Lydia Buchanan Date of Service: 07/01/2017 10:15 AM Medical Record Number: [id number] Patient Account Number: [id number] Date of Birth/Sex: 06/09/1932 (84 y.o. F) Treating RN: Renne CriglerFlinchum, Cheryl Primary Care Akin Yi: PATIENT, NO Other Clinician: Referring Finesse Fielder: Franchot MimesBISSELL, BRAD Treating Hadasah Brugger/Extender: Kathreen Cosieroulter, Leah Weeks in Treatment: 7 Visit Information History Since Last Visit All ordered tests and consults were completed: No Patient Arrived: Walker Added or deleted any medications: No Arrival Time: 10:25 Any new allergies or adverse reactions: No Accompanied By: caregiver Had a fall or experienced change in No Transfer Assistance: None activities of daily living that may affect Patient Identification Verified: Yes risk of falls: Secondary Verification Process Completed: Yes Signs or symptoms of abuse/neglect since last visito No Hospitalized since last visit: No Implantable device outside of the clinic excluding No cellular tissue based products placed in the center since last visit: Pain Present Now: No Electronic Signature(s) Signed: 07/01/2017 10:41:08 AM By: Renne CriglerFlinchum, Cheryl Entered By: Renne CriglerFlinchum, Cheryl on 07/01/2017 10:28:18 Lydia Buchanan, Lydia Buchanan ([id number]) -------------------------------------------------------------------------------- Clinic Level of Care Assessment Details Patient Name: Lydia Buchanan, Lydia Buchanan Date of Service: 07/01/2017 10:15 AM Medical Record Number: [id number] Patient Account Number: [id number] Date of Birth/Sex: 05/21/1932 (84 y.o. F) Treating RN: Phillis HaggisPinkerton, Debi Primary Care Jayle Solarz: PATIENT, NO Other Clinician: Referring Detrice Cales: Franchot MimesBISSELL, BRAD Treating Rachyl Wuebker/Extender: Kathreen Cosieroulter, Leah Weeks in Treatment: 7 Clinic Level of Care Assessment Items TOOL 4 Quantity Score X - Use when only an EandM is performed on FOLLOW-UP visit 1 0 ASSESSMENTS -  Nursing Assessment / Reassessment X - Reassessment of Co-morbidities (includes updates in patient status) 1 10 X- 1 5 Reassessment of Adherence to Treatment Plan ASSESSMENTS - Wound and Skin Assessment / Reassessment X - Simple Wound Assessment / Reassessment - one wound 1 5 []  - 0 Complex Wound Assessment / Reassessment - multiple wounds []  - 0 Dermatologic / Skin Assessment (not related to wound area) ASSESSMENTS - Focused Assessment []  - Circumferential Edema Measurements - multi extremities 0 []  - 0 Nutritional Assessment / Counseling / Intervention []  - 0 Lower Extremity Assessment (monofilament, tuning fork, pulses) []  - 0 Peripheral Arterial Disease Assessment (using hand held doppler) ASSESSMENTS - Ostomy and/or Continence Assessment and Care []  - Incontinence Assessment and Management 0 []  - 0 Ostomy Care Assessment and Management (repouching, etc.) PROCESS - Coordination of Care []  - Simple Patient / Family Education for ongoing care 0 X- 1 20 Complex (extensive) Patient / Family Education for ongoing care X- 1 10 Staff obtains ChiropractorConsents, Records, Test Results / Process Orders X- 1 10 Staff telephones HHA, Nursing Homes / Clarify orders / etc []  - 0 Routine Transfer to another Facility (non-emergent condition) []  - 0 Routine Hospital Admission (non-emergent condition) []  - 0 New Admissions / Manufacturing engineernsurance Authorizations / Ordering NPWT, Apligraf, etc. []  - 0 Emergency Hospital Admission (emergent condition) X- 1 10 Simple Discharge Coordination [place], Lydia Buchanan ([id number]) []  - 0 Complex (extensive) Discharge Coordination PROCESS - Special Needs []  - Pediatric / Minor Patient Management 0 []  - 0 Isolation Patient Management []  - 0 Hearing / Language / Visual special needs []  - 0 Assessment of Community assistance (transportation, D/C planning, etc.) []  - 0 Additional assistance / Altered mentation []  - 0 Support Surface(s) Assessment (bed, cushion, seat,  etc.) INTERVENTIONS - Wound Cleansing / Measurement X - Simple Wound Cleansing - one wound 1 5 []  - 0 Complex Wound Cleansing - multiple wounds X- 1 5 Wound Imaging (photographs - any number of wounds) []  -  0 Wound Tracing (instead of photographs) []  - 0 Simple Wound Measurement - one wound []  - 0 Complex Wound Measurement - multiple wounds INTERVENTIONS - Wound Dressings []  - Small Wound Dressing one or multiple wounds 0 []  - 0 Medium Wound Dressing one or multiple wounds []  - 0 Large Wound Dressing one or multiple wounds []  - 0 Application of Medications - topical []  - 0 Application of Medications - injection INTERVENTIONS - Miscellaneous []  - External ear exam 0 []  - 0 Specimen Collection (cultures, biopsies, blood, body fluids, etc.) []  - 0 Specimen(s) / Culture(s) sent or taken to Lab for analysis []  - 0 Patient Transfer (multiple staff / Nurse, adult / Similar devices) []  - 0 Simple Staple / Suture removal (25 or less) []  - 0 Complex Staple / Suture removal (26 or more) []  - 0 Hypo / Hyperglycemic Management (close monitor of Blood Glucose) []  - 0 Ankle / Brachial Index (ABI) - do not check if billed separately X- 1 5 Vital Signs Carriere, Lydia Buchanan ([id number]) Has the patient been seen at the hospital within the last three years: Yes Total Score: 85 Level Of Care: New/Established - Level 3 Electronic Signature(s) Signed: 07/01/2017 4:29:22 PM By: Alejandro Mulling Entered By: Alejandro Mulling on 07/01/2017 11:19:08 Lydia Buchanan ([id number]) -------------------------------------------------------------------------------- Encounter Discharge Information Details Patient Name: Lydia Buchanan Date of Service: 07/01/2017 10:15 AM Medical Record Number: [id number] Patient Account Number: [id number] Date of Birth/Sex: [birth year] (84 y.o. F) Treating RN: Phillis Haggis Primary Care Santi Troung: PATIENT, NO Other Clinician: Referring Dameon Soltis: Franchot Mimes Treating  Coleston Dirosa/Extender: Kathreen Cosier in Treatment: 7 Encounter Discharge Information Items Discharge Condition: Stable Ambulatory Status: Walker Discharge Destination: Home Transportation: Private Auto Accompanied By: caregiver Schedule Follow-up Appointment: No Clinical Summary of Care: Electronic Signature(s) Signed: 07/01/2017 4:29:22 PM By: Alejandro Mulling Entered By: Alejandro Mulling on 07/01/2017 10:41:11 Lydia Buchanan ([id number]) -------------------------------------------------------------------------------- Lower Extremity Assessment Details Patient Name: Lydia Buchanan Date of Service: 07/01/2017 10:15 AM Medical Record Number: [id number] Patient Account Number: [id number] Date of Birth/Sex: 1932-05-02 (84 y.o. F) Treating RN: Renne Crigler Primary Care Mailyn Steichen: PATIENT, NO Other Clinician: Referring Bradford Cazier: Franchot Mimes Treating Keiondre Colee/Extender: Kathreen Cosier in Treatment: 7 Edema Assessment Assessed: [Left: No] [Right: No] Edema: [Left: Ye] [Right: s] Calf Left: Right: Point of Measurement: 32 cm From Medial Instep cm 30.4 cm Ankle Left: Right: Point of Measurement: 10 cm From Medial Instep cm 22.5 cm Vascular Assessment Claudication: Claudication Assessment [Right:None] Pulses: Dorsalis Pedis Palpable: [Right:Yes] Posterior Tibial Extremity colors, hair growth, and conditions: Extremity Color: [Right:Normal] Hair Growth on Extremity: [Right:No] Temperature of Extremity: [Right:Warm] Capillary Refill: [Right:< 3 seconds] Toe Nail Assessment Left: Right: Thick: Yes Discolored: Yes Deformed: No Improper Length and Hygiene: No Electronic Signature(s) Signed: 07/01/2017 10:41:08 AM By: Renne Crigler Entered By: Renne Crigler on 07/01/2017 10:35:15 Lydia Buchanan ([id number]) -------------------------------------------------------------------------------- Multi Wound Chart Details Patient Name: Lydia Buchanan Date of  Service: 07/01/2017 10:15 AM Medical Record Number: [id number] Patient Account Number: [id number] Date of Birth/Sex: 1932-07-04 (84 y.o. F) Treating RN: Phillis Haggis Primary Care Handy Mcloud: PATIENT, NO Other Clinician: Referring Delia Sitar: Franchot Mimes Treating Mariaelena Cade/Extender: Kathreen Cosier in Treatment: 7 Vital Signs Height(in): 62 Pulse(bpm): 77 Weight(lbs): 112 Blood Pressure(mmHg): 115/67 Body Mass Index(BMI): 20 Temperature(F): 98.1 Respiratory Rate 18 (breaths/min): Photos: [2:No Photos] [N/A:N/A] Wound Location: [2:Right, Posterior Lower Leg] [N/A:N/A] Wounding Event: [2:Gradually Appeared] [N/A:N/A] Primary Etiology: [2:Venous Leg Ulcer] [N/A:N/A] Comorbid History: [2:Cataracts, Arrhythmia, Congestive Heart Failure, Hypertension] [N/A:N/A] Date Acquired: [2:03/29/2017] [N/A:N/A] Weeks of Treatment: [2:7] [N/A:N/A] Wound  Status: [2:Healed - Epithelialized] [N/A:N/A] Measurements L x W x D [2:0x0x0] [N/A:N/A] (cm) Area (cm) : [2:0] [N/A:N/A] Volume (cm) : [2:0] [N/A:N/A] % Reduction in Area: [2:100.00%] [N/A:N/A] % Reduction in Volume: [2:100.00%] [N/A:N/A] Classification: [2:Partial Thickness] [N/A:N/A] Exudate Amount: [2:None Present] [N/A:N/A] Granulation Amount: [2:None Present (0%)] [N/A:N/A] Necrotic Amount: [2:None Present (0%)] [N/A:N/A] Exposed Structures: [2:Fascia: No Fat Layer (Subcutaneous Tissue) Exposed: No Tendon: No Muscle: No Joint: No Bone: No] [N/A:N/A] Epithelialization: [2:Large (67-100%)] [N/A:N/A] Periwound Skin Texture: [2:Excoriation: No Induration: No Callus: No Crepitus: No Rash: No Scarring: No] [N/A:N/A] Periwound Skin Moisture: [2:Maceration: No Dry/Scaly: No] [N/A:N/A] Periwound Skin Color: [N/A:N/A] Atrophie Blanche: No Cyanosis: No Ecchymosis: No Erythema: No Hemosiderin Staining: No Mottled: No Pallor: No Rubor: No Tenderness on Palpation: No N/A N/A Wound Preparation: Ulcer Cleansing: Not Cleansed N/A  N/A Topical Anesthetic Applied: None Treatment Notes Electronic Signature(s) Signed: 07/01/2017 11:11:53 AM By: Bonnell Public Entered By: Bonnell Public on 07/01/2017 11:11:53 Lydia Buchanan ([id number]) -------------------------------------------------------------------------------- Multi-Disciplinary Care Plan Details Patient Name: Lydia Buchanan Date of Service: 07/01/2017 10:15 AM Medical Record Number: [id number] Patient Account Number: [id number] Date of Birth/Sex: 01-17-1932 (84 y.o. F) Treating RN: Renne Crigler Primary Care Mekel Haverstock: PATIENT, NO Other Clinician: Referring Bronx Brogden: Franchot Mimes Treating Larsen Zettel/Extender: Kathreen Cosier in Treatment: 7 Active Inactive Electronic Signature(s) Signed: 07/01/2017 10:41:08 AM By: Renne Crigler Signed: 07/01/2017 4:29:22 PM By: Alejandro Mulling Entered By: Alejandro Mulling on 07/01/2017 10:38:51 Lydia Buchanan ([id number]) -------------------------------------------------------------------------------- Pain Assessment Details Patient Name: Lydia Buchanan Date of Service: 07/01/2017 10:15 AM Medical Record Number: [id number] Patient Account Number: [id number] Date of Birth/Sex: [birth year] (84 y.o. F) Treating RN: Renne Crigler Primary Care Carrel Leather: PATIENT, NO Other Clinician: Referring Shantrice Rodenberg: Franchot Mimes Treating Jermani Pund/Extender: Kathreen Cosier in Treatment: 7 Active Problems Location of Pain Severity and Description of Pain Patient Has Paino No Site Locations Pain Management and Medication Current Pain Management: Electronic Signature(s) Signed: 07/01/2017 10:41:08 AM By: Renne Crigler Entered By: Renne Crigler on 07/01/2017 10:28:29 Lydia Buchanan ([id number]) -------------------------------------------------------------------------------- Patient/Caregiver Education Details Patient Name: Lydia Buchanan Date of Service: 07/01/2017 10:15 AM Medical Record Number:  [id number] Patient Account Number: [id number] Date of Birth/Gender: [birth year] (84 y.o. F) Treating RN: Phillis Haggis Primary Care Physician: PATIENT, NO Other Clinician: Referring Physician: Franchot Mimes Treating Physician/Extender: Kathreen Cosier in Treatment: 7 Education Assessment Education Provided To: Patient Education Topics Provided Wound/Skin Impairment: Handouts: Other: Please call our office if you have any questions or concerns. Methods: Explain/Verbal Responses: State content correctly Electronic Signature(s) Signed: 07/01/2017 4:29:22 PM By: Alejandro Mulling Entered By: Alejandro Mulling on 07/01/2017 10:41:30 Lydia Buchanan ([id number]) -------------------------------------------------------------------------------- Wound Assessment Details Patient Name: Lydia Buchanan Date of Service: 07/01/2017 10:15 AM Medical Record Number: [id number] Patient Account Number: [id number] Date of Birth/Sex: 23-Nov-1932 (84 y.o. F) Treating RN: Phillis Haggis Primary Care Calogero Geisen: PATIENT, NO Other Clinician: Referring Laurella Tull: Franchot Mimes Treating Torien Ramroop/Extender: Kathreen Cosier in Treatment: 7 Wound Status Wound Number: 2 Primary Venous Leg Ulcer Etiology: Wound Location: Right, Posterior Lower Leg Wound Status: Healed - Epithelialized Wounding Event: Gradually Appeared Comorbid Cataracts, Arrhythmia, Congestive Heart Date Acquired: 03/29/2017 History: Failure, Hypertension Weeks Of Treatment: 7 Clustered Wound: No Photos Photo Uploaded By: Renne Crigler on 07/01/2017 16:20:29 Wound Measurements Length: (cm) 0 Width: (cm) 0 Depth: (cm) 0 Area: (cm) 0 Volume: (cm) 0 % Reduction in Area: 100% % Reduction in Volume: 100% Epithelialization: Large (67-100%) Tunneling: No Undermining: No Wound Description Classification: Partial Thickness Exudate Amount: None Present Foul Odor After Cleansing: No Slough/Fibrino No Wound Bed Granulation  Amount: None Present (0%) Exposed Structure Necrotic Amount: None Present (0%) Fascia Exposed: No Fat Layer (Subcutaneous Tissue) Exposed: No Tendon Exposed: No Muscle Exposed: No Joint Exposed: No Bone Exposed: No Periwound Skin Texture Texture Color No Abnormalities Noted: Yes No Abnormalities Noted: No Lydia Buchanan, Lydia Buchanan ([id number]) Moisture Atrophie Blanche: No No Abnormalities Noted: No Cyanosis: No Dry / Scaly: No Ecchymosis: No Maceration: No Erythema: No Hemosiderin Staining: No Mottled: No Pallor: No Rubor: No Wound Preparation Ulcer Cleansing: Not Cleansed Topical Anesthetic Applied: None Electronic Signature(s) Signed: 07/01/2017 4:29:22 PM By: Alejandro Mulling Entered By: Alejandro Mulling on 07/01/2017 10:39:23 Lydia Buchanan ([id number]) -------------------------------------------------------------------------------- Vitals Details Patient Name: Lydia Buchanan Date of Service: 07/01/2017 10:15 AM Medical Record Number: [id number] Patient Account Number: [id number] Date of Birth/Sex: 1932-08-19 (84 y.o. F) Treating RN: Renne Crigler Primary Care Jaylanni Eltringham: PATIENT, NO Other Clinician: Referring Diondra Pines: Franchot Mimes Treating Kazandra Forstrom/Extender: Kathreen Cosier in Treatment: 7 Vital Signs Time Taken: 10:28 Temperature (F): 98.1 Height (in): 62 Pulse (bpm): 77 Weight (lbs): 112 Respiratory Rate (breaths/min): 18 Body Mass Index (BMI): 20.5 Blood Pressure (mmHg): 115/67 Reference Range: 80 - 120 mg / dl Electronic Signature(s) Signed: 07/01/2017 10:41:08 AM By: Renne Crigler Entered By: Renne Crigler on 07/01/2017 10:29:16

## 2017-07-05 ENCOUNTER — Telehealth: Payer: Self-pay

## 2017-07-05 NOTE — Progress Notes (Signed)
SUHEY, RADFORD ([id number]) Visit Report for 07/01/2017 Chief Complaint Document Details Patient Name: Lydia Buchanan, Lydia Buchanan Date of Service: 07/01/2017 10:15 AM Medical Record Number: [id number] Patient Account Number: [id number] Date of Birth/Sex: 1932-08-01 (84 y.o. F) Treating RN: Phillis Haggis Primary Care Provider: PATIENT, NO Other Clinician: Referring Provider: Franchot Mimes Treating Provider/Extender: Kathreen Cosier in Treatment: 7 Information Obtained from: Patient Chief Complaint She is here for evaluation of lower extremity ulcers Electronic Signature(s) Signed: 07/01/2017 11:12:12 AM By: Bonnell Public Entered By: Bonnell Public on 07/01/2017 11:12:12 Lydia Buchanan ([id number]) -------------------------------------------------------------------------------- HPI Details Patient Name: Lydia Buchanan Date of Service: 07/01/2017 10:15 AM Medical Record Number: [id number] Patient Account Number: [id number] Date of Birth/Sex: 09/29/1932 (84 y.o. F) Treating RN: Phillis Haggis Primary Care Provider: PATIENT, NO Other Clinician: Referring Provider: Franchot Mimes Treating Provider/Extender: Kathreen Cosier in Treatment: 7 History of Present Illness HPI Description: 05/13/17-She is here for initial evaluation of bilateral location big ulcers. She recently moved here from [place], resides at [facility]. She states that approximately 6-8 weeks ago she developed bilateral lower extremity edema with blisters which resulted in ulcerations. She has been going to [place] [facility] and was being treated with Xeroform and thigh-high TED hose compression. She was on Eliquis for atrial fibrillation, but that was DC'd approximately 3 weeks ago by cardiology when she was hospitalized after uncontrolled bleeding from the left lower extremity ulcer. She is extremely anxious, nervous and would prefer no debridement at today's appointment; repeating questions. We will  initiate home health. She is non-diabetic and a former smoker, quit  10 years ago. According to Epic records, she has been to the emergency department twice since arrival to [facility]; visit on 4/28 for BLE pain a DVT study was done and negative on 4/30 visit her daughter requested a psych evaluation r/t manipulative behavior and extreme anxiety. 05/20/17 on evaluation today patient's wounds actually show signs of being a little bit better compared to last week's evaluation that I was not the provider see her at that point. She does have some Slough noted on the surface of the wound beds but fortunately there does not appear to be any evidence of significant infection. No fevers, chills, nausea, or vomiting noted at this time. Patient does appear to be extremely anxious. 05/27/17-She is here in follow-up evaluation for bilateral lower extremity ulcers. She is extremely anxious. It is noted that last week there was bleeding complications with the left lower extremity ulcer, there is residual chemical cautery effect. We will defer debridement, overall there is improvement to these ulcers. We will continue with Iodosorb/Iodoflex and 3 layer compression wrap. 06/03/17-She is here in follow up evaluation for bilateral lower extremity ulcers. Home health was then able to evaluate on Monday; she was seen in the emergency room for anxiety/panic attack and taken home Tuesday evening. There has been improvement in ulceration since last evaluation and we will continue with the same treatment plan she will follow-up next week. 06/10/17- She is here in follow up evaluation for bilateral lower extremity ulcers. The left leg is almost healed; right leg is improved. She remains extremely anxious but did take the [place] today, which is an improvement. She is allowing home health to change her dressings; we will change dressings and she will follow up next week 06/17/17-She is here in follow-up evaluation for bilateral  lower extremity ulcers. The left lower extremity is epithelialized, the right anterior ulcer is epithelialized, the right posterior ulcer is partial-thickness. We will continue with compression therapy;  home health can remove left leg compression therapy on Monday. She will follow-up next week. She remains quite anxious but did come to clinic with a "friend" on the facility bus. 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 She does not come to last week's appointment secondary to anxiety. She is healed and will be discharged from the wound clinic Electronic Signature(s) Signed: 07/01/2017 11:12:46 AM By: Bonnell Public Entered By: Bonnell Public on 07/01/2017 11:12:46 Lydia Buchanan ([id number]) -------------------------------------------------------------------------------- Physician Orders Details Patient Name: Lydia Buchanan Date of Service: 07/01/2017 10:15 AM Medical Record Number: [id number] Patient Account Number: [id number] Date of Birth/Sex: [birth year] (84 y.o. F) Treating RN: Phillis Haggis Primary Care Provider: PATIENT, NO Other Clinician: Referring Provider: Franchot Mimes Treating Provider/Extender: Kathreen Cosier in Treatment: 7 Verbal / Phone Orders: Yes Clinician: Ashok Cordia, Debi Read Back and Verified: Yes Diagnosis Coding Home Health o D/C Home Health Services Discharge From [facility] Services o Discharge from Wound Care Center - Please call our office if you have any questions or concerns. Electronic Signature(s) Signed: 07/01/2017 12:36:14 PM By: Alejandro Mulling Signed: 07/01/2017 5:32:34 PM By: Bonnell Public Entered By: Alejandro Mulling on 07/01/2017 12:36:14 Lydia Buchanan ([id number]) -------------------------------------------------------------------------------- Problem List Details Patient Name: Lydia Buchanan Date of Service: 07/01/2017 10:15 AM Medical Record Number: [id number] Patient Account Number: [id number] Date of  Birth/Sex: 30-Nov-1932 (84 y.o. F) Treating RN: Phillis Haggis Primary Care Provider: PATIENT, NO Other Clinician: Referring Provider: Franchot Mimes Treating Provider/Extender: Kathreen Cosier in Treatment: 7 Active Problems ICD-10 Evaluated Encounter Code Description Active Date Today Diagnosis I83.893 Varicose veins of bilateral lower extremities with other 05/13/2017 No Yes complications L97.212 Non-pressure chronic ulcer of right calf with fat layer exposed 05/13/2017 No Yes L97.222 Non-pressure chronic ulcer of left calf with fat layer exposed 05/13/2017 No Yes Inactive Problems Resolved Problems Electronic Signature(s) Signed: 07/01/2017 11:11:43 AM By: Bonnell Public Entered By: Bonnell Public on 07/01/2017 11:11:43 Lydia Buchanan ([id number]) -------------------------------------------------------------------------------- Progress Note Details Patient Name: Lydia Buchanan Date of Service: 07/01/2017 10:15 AM Medical Record Number: [id number] Patient Account Number: [id number] Date of Birth/Sex: 27-Apr-1932 (84 y.o. F) Treating RN: Phillis Haggis Primary Care Provider: PATIENT, NO Other Clinician: Referring Provider: Franchot Mimes Treating Provider/Extender: Kathreen Cosier in Treatment: 7 Subjective Chief Complaint Information obtained from Patient She is here for evaluation of lower extremity ulcers History of Present Illness (HPI) 05/13/17-She is here for initial evaluation of bilateral location big ulcers. She recently moved here from [place], resides at [facility]. She states that approximately 6-8 weeks ago she developed bilateral lower extremity edema with blisters which resulted in ulcerations. She has been going to [facility] [facility] and was being treated with Xeroform and thigh-high TED hose compression. She was on Eliquis for atrial fibrillation, but that was DC'd approximately 3 weeks ago by cardiology when she was hospitalized after uncontrolled  bleeding from the left lower extremity ulcer. She is extremely anxious, nervous and would prefer no debridement at today's appointment; repeating questions. We will initiate home health. She is non-diabetic and a former smoker, quit  10 years ago. According to Epic records, she has been to the emergency department twice since arrival to [facility]; visit on 4/28 for BLE pain a DVT study was done and negative on 4/30 visit her daughter requested a psych evaluation r/t manipulative behavior and extreme anxiety. 05/20/17 on evaluation today patient's wounds actually show signs of being a little bit better compared to last week's evaluation that I was not the provider see her at that  point. She does have some Slough noted on the surface of the wound beds but fortunately there does not appear to be any evidence of significant infection. No fevers, chills, nausea, or vomiting noted at this time. Patient does appear to be extremely anxious. 05/27/17-She is here in follow-up evaluation for bilateral lower extremity ulcers. She is extremely anxious. It is noted that last week there was bleeding complications with the left lower extremity ulcer, there is residual chemical cautery effect. We will defer debridement, overall there is improvement to these ulcers. We will continue with Iodosorb/Iodoflex and 3 layer compression wrap. 06/03/17-She is here in follow up evaluation for bilateral lower extremity ulcers. Home health was then able to evaluate on Monday; she was seen in the emergency room for anxiety/panic attack and taken home Tuesday evening. There has been improvement in ulceration since last evaluation and we will continue with the same treatment plan she will follow-up next week. 06/10/17- She is here in follow up evaluation for bilateral lower extremity ulcers. The left leg is almost healed; right leg is improved. She remains extremely anxious but did take the [place] today, which is an improvement. She is  allowing home health to change her dressings; we will change dressings and she will follow up next week 06/17/17-She is here in follow-up evaluation for bilateral lower extremity ulcers. The left lower extremity is epithelialized, the right anterior ulcer is epithelialized, the right posterior ulcer is partial-thickness. We will continue with compression therapy; home health can remove left leg compression therapy on Monday. She will follow-up next week. She remains quite anxious but did come to clinic with a "friend" on the facility bus. 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 She does not come to last week's appointment secondary to anxiety. She is healed and will be discharged from the wound clinic Patient History Information obtained from Patient. Family History Cancer - Child, Heart Disease - Mother,Father, Hypertension - Mother,Father, No family history of Diabetes, Hereditary Spherocytosis, Kidney Disease, Lung Disease, Seizures, Stroke, Thyroid Problems, Tuberculosis. ELNOR, RENOVATO ([id number]) Social History Former smoker - 10 years, Marital Status - Widowed, Alcohol Use - Never, Drug Use - No History, Caffeine Use - Daily. Medical And Surgical History Notes Neurologic TIA Review of Systems (ROS) Psychiatric Complains or has symptoms of Anxiety. Objective Constitutional Vitals Time Taken: 10:28 AM, Height: 62 in, Weight: 112 lbs, BMI: 20.5, Temperature: 98.1 F, Pulse: 77 bpm, Respiratory Rate: 18 breaths/min, Blood Pressure: 115/67 mmHg. Integumentary (Hair, Skin) Wound #2 status is Healed - Epithelialized. Original cause of wound was Gradually Appeared. The wound is located on the Right,Posterior Lower Leg. The wound measures 0cm length x 0cm width x 0cm depth; 0cm^2 area and 0cm^3 volume. There is no tunneling or undermining noted. There is a none present amount of drainage noted. There is no granulation within the wound bed. There is no necrotic  tissue within the wound bed. The periwound skin appearance had no abnormalities noted for texture. The periwound skin appearance did not exhibit: Dry/Scaly, Maceration, Atrophie Blanche, Cyanosis, Ecchymosis, Hemosiderin Staining, Mottled, Pallor, Rubor, Erythema. 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 DESTANIE, TIBBETTS ([id number]) Electronic Signature(s) Signed: 07/01/2017 5:33:15 PM By:  Winter Trefz Previous Signature: 07/01/2017 11:13:22 AM Version By: Bonnell Publicoulter, Wilhemina Grall Entered By: Bonnell Publicoulter, Chantil Bari on 07/01/2017 17:33:15 Lydia LoronAMANN, Esmay ([id number]) -------------------------------------------------------------------------------- ROS/PFSH Details Patient Name: Lydia LoronAMANN, Sharnee Date of Service: 07/01/2017 10:15 AM Medical Record Number: [id number] Patient Account Number: [id number] Date of Birth/Sex: 09/25/1932 (84 y.o. F) Treating RN: Phillis HaggisPinkerton, Debi Primary Care Provider: PATIENT, NO Other Clinician: Referring Provider: Franchot MimesBISSELL, BRAD Treating Provider/Extender: Kathreen Cosieroulter, Manpreet Strey Weeks in Treatment: 7 Information Obtained From Patient Wound History Do you currently have one or more open woundso Yes How many open wounds do you currently haveo 2 Approximately how long have you had your woundso 6 weeks How have you been treating your wound(s) until nowo unknown Has your wound(s) ever healed and then re-openedo No Have you had any lab work done in the past montho No Have you tested positive for an antibiotic resistant organism (MRSA, VRE)o No Have you tested positive for osteomyelitis (bone infection)o No Have you had any tests for  circulation on your legso No Psychiatric Complaints and Symptoms: Positive for: Anxiety Medical History: Negative for: Anorexia/bulimia; Confinement Anxiety Eyes Medical History: Positive for: Cataracts - removed Negative for: Glaucoma; Optic Neuritis Ear/Nose/Mouth/Throat Medical History: Negative for: Chronic sinus problems/congestion; Middle ear problems Hematologic/Lymphatic Medical History: Negative for: Anemia; Hemophilia; Human Immunodeficiency Virus; Lymphedema; Sickle Cell Disease Respiratory Medical History: Negative for: Aspiration; Asthma; Chronic Obstructive Pulmonary Disease (COPD); Pneumothorax; Sleep Apnea; Tuberculosis Cardiovascular Medical History: Positive for: Arrhythmia - a fib; Congestive Heart Failure; Hypertension Negative for: Angina; Coronary Artery Disease; Deep Vein Thrombosis; Hypotension; Myocardial Infarction; Peripheral Arterial Disease; Peripheral Venous Disease; Phlebitis; Vasculitis Stauder, Najai ([id number]) Gastrointestinal Medical History: Negative for: Cirrhosis ; Colitis; Crohnos; Hepatitis A; Hepatitis B; Hepatitis C Endocrine Medical History: Negative for: Type I Diabetes; Type II Diabetes Genitourinary Medical History: Negative for: End Stage Renal Disease Immunological Medical History: Negative for: Lupus Erythematosus; Raynaudos; Scleroderma Integumentary (Skin) Medical History: Negative for: History of Burn; History of pressure wounds Musculoskeletal Medical History: Negative for: Gout; Rheumatoid Arthritis; Osteoarthritis; Osteomyelitis Neurologic Medical History: Negative for: Dementia; Neuropathy; Quadriplegia; Paraplegia; Seizure Disorder Past Medical History Notes: TIA Oncologic Medical History: Negative for: Received Chemotherapy; Received Radiation HBO Extended History Items Eyes: Cataracts Immunizations Pneumococcal Vaccine: Received Pneumococcal Vaccination: Yes Implantable Devices Family and Social  History Cancer: Yes - Child; Diabetes: No; Heart Disease: Yes - Mother,Father; Hereditary Spherocytosis: No; Hypertension: Yes - Mother,Father; Kidney Disease: No; Lung Disease: No; Seizures: No; Stroke: No; Thyroid Problems: No; Tuberculosis: No; Former smoker - 10 years; Marital Status - Widowed; Alcohol Use: Never; Drug Use: No History; Caffeine Use: Daily; Financial Concerns: No; Food, Clothing or Shelter Needs: No; Support System Lacking: No; Transportation Concerns: No; Advanced Directives: No; Patient does not want information on Advanced Directives Lydia LoronRAMANN, Connelly ([id number]) Physician Affirmation I have reviewed and agree with the above information. Electronic Signature(s) Signed: 07/01/2017 4:29:22 PM By: Alejandro MullingPinkerton, Debra Signed: 07/01/2017 5:32:34 PM By: Bonnell Publicoulter, Tyreonna Czaplicki Entered By: Bonnell Publicoulter, Kandis Henry on 07/01/2017 11:13:06 Lydia LoronAMANN, Kynnedi ([id number]) -------------------------------------------------------------------------------- SuperBill Details Patient Name: Lydia LoronAMANN, Debbra Date of Service: 07/01/2017 Medical Record Number: [id number] Patient Account Number: [id number] Date of Birth/Sex: 03/05/1932 (84 y.o. F) Treating RN: Phillis HaggisPinkerton, Debi Primary Care Provider: PATIENT, NO Other Clinician: Referring Provider: Franchot MimesBISSELL, BRAD Treating Provider/Extender: Kathreen Cosieroulter, Abriana Saltos Weeks in Treatment: 7 Diagnosis Coding ICD-10 Codes Code Description [phone] Varicose veins of bilateral lower extremities with other complications L97.212 Non-pressure chronic ulcer of right calf with fat layer exposed L97.222 Non-pressure chronic ulcer of left calf with fat layer exposed Facility Procedures CPT4 Code: [id number] Description: [id number] -  WOUND CARE VISIT-LEV 3 EST PT Modifier: Quantity: 1 Physician Procedures CPT4 Code: [id number] Description: 99212 - WC PHYS LEVEL 2 - EST PT ICD-10 Diagnosis Description L97.212 Non-pressure chronic ulcer of right calf with fat layer exp Modifier: osed Quantity:  1 Electronic Signature(s) Signed: 07/01/2017 11:19:21 AM By: Alejandro Mulling Signed: 07/01/2017 5:32:34 PM By: Bonnell Public Previous Signature: 07/01/2017 11:13:34 AM Version By: Bonnell Public Entered By: Alejandro Mulling on 07/01/2017 11:19:21

## 2017-07-05 NOTE — Telephone Encounter (Signed)
son called checking on status of the lab test.

## 2017-07-06 ENCOUNTER — Emergency Department
Admission: EM | Admit: 2017-07-06 | Discharge: 2017-07-06 | Disposition: A | Discharge status: Home / Self Care | Payer: Medicare Other | Attending: Emergency Medicine | Admitting: Emergency Medicine

## 2017-07-06 ENCOUNTER — Telehealth: Payer: Self-pay | Admitting: Psychiatry

## 2017-07-06 ENCOUNTER — Other Ambulatory Visit: Payer: Self-pay

## 2017-07-06 DIAGNOSIS — F419 Anxiety disorder, unspecified: Secondary | ICD-10-CM | POA: Diagnosis not present

## 2017-07-06 DIAGNOSIS — Z8673 Personal history of transient ischemic attack (TIA), and cerebral infarction without residual deficits: Secondary | ICD-10-CM | POA: Diagnosis not present

## 2017-07-06 DIAGNOSIS — I509 Heart failure, unspecified: Secondary | ICD-10-CM | POA: Diagnosis not present

## 2017-07-06 DIAGNOSIS — I110 Hypertensive heart disease with heart failure: Secondary | ICD-10-CM | POA: Diagnosis not present

## 2017-07-06 DIAGNOSIS — R630 Anorexia: Secondary | ICD-10-CM | POA: Diagnosis not present

## 2017-07-06 DIAGNOSIS — Z87891 Personal history of nicotine dependence: Secondary | ICD-10-CM | POA: Insufficient documentation

## 2017-07-06 LAB — COMPREHENSIVE METABOLIC PANEL
ALBUMIN: 3.8 g/dL (ref 3.5–5.0)
ALT: 18 U/L (ref 0–44)
AST: 28 U/L (ref 15–41)
Alkaline Phosphatase: 74 U/L (ref 38–126)
Anion gap: 7 (ref 5–15)
BILIRUBIN TOTAL: 0.8 mg/dL (ref 0.3–1.2)
BUN: 24 mg/dL — AB (ref 8–23)
CHLORIDE: 104 mmol/L (ref 98–111)
CO2: 28 mmol/L (ref 22–32)
Calcium: 8.8 mg/dL — ABNORMAL LOW (ref 8.9–10.3)
Creatinine, Ser: 1 mg/dL (ref 0.44–1.00)
GFR calc Af Amer: 59 mL/min — ABNORMAL LOW (ref >60)
GFR calc non Af Amer: 51 mL/min — ABNORMAL LOW (ref >60)
Glucose, Bld: 89 mg/dL (ref 70–99)
POTASSIUM: 3.5 mmol/L (ref 3.5–5.1)
Sodium: 139 mmol/L (ref 135–145)
Total Protein: 7.4 g/dL (ref 6.5–8.1)

## 2017-07-06 LAB — URINALYSIS, COMPLETE (UACMP) WITH MICROSCOPIC
BILIRUBIN URINE: NEGATIVE
Bacteria, UA: NONE SEEN
GLUCOSE, UA: NEGATIVE mg/dL
Hgb urine dipstick: NEGATIVE
KETONES UR: NEGATIVE mg/dL
LEUKOCYTES UA: NEGATIVE
Nitrite: NEGATIVE
PH: 7 (ref 5.0–8.0)
Protein, ur: NEGATIVE mg/dL
Specific Gravity, Urine: 1.006 (ref 1.005–1.030)

## 2017-07-06 LAB — CBC WITH DIFFERENTIAL/PLATELET
BASOS ABS: 0.1 10*3/uL (ref 0–0.1)
BASOS PCT: 1 %
Eosinophils Absolute: 0.1 10*3/uL (ref 0–0.7)
Eosinophils Relative: 2 %
HEMATOCRIT: 36.8 % (ref 35.0–47.0)
Hemoglobin: 12.1 g/dL (ref 12.0–16.0)
LYMPHS PCT: 16 %
Lymphs Abs: 1.1 10*3/uL (ref 1.0–3.6)
MCH: 29.8 pg (ref 26.0–34.0)
MCHC: 33 g/dL (ref 32.0–36.0)
MCV: 90.2 fL (ref 80.0–100.0)
MONO ABS: 0.4 10*3/uL (ref 0.2–0.9)
Monocytes Relative: 6 %
NEUTROS ABS: 4.9 10*3/uL (ref 1.4–6.5)
NEUTROS PCT: 75 %
Platelets: 237 10*3/uL (ref 150–440)
RBC: 4.08 MIL/uL (ref 3.80–5.20)
RDW: 15.7 % — AB (ref 11.5–14.5)
WBC: 6.6 10*3/uL (ref 3.6–11.0)

## 2017-07-06 LAB — TROPONIN I

## 2017-07-06 MED ORDER — LORAZEPAM 0.5 MG PO TABS
0.5000 mg | ORAL_TABLET | Freq: Once | ORAL | Status: AC
  Administered 2017-07-06: 0.5 mg via ORAL
  Filled 2017-07-06: qty 1

## 2017-07-06 NOTE — ED Provider Notes (Signed)
[facility] Emergency Department Provider Note       Time seen: ----------------------------------------- 9:13 AM on 07/06/2017 -----------------------------------------   I have reviewed the triage vital signs and the nursing notes.  HISTORY   Chief Complaint anxious    HPI Lydia Buchanan is a 84 y.o. female with a history of Atrial fibrillation, anxiety, CHF, hypertension, thyroid disease and TIA who presents to the ED for anxiety.  [facility]  [facility] has reported that she has had decreased intake and appears very anxious.  There has been recent changes to her medicines without any improvement.  She denies any complaints currently.  Past Medical History:  Diagnosis Date  . A-fib (HCC)   . Anxiety   . CHF (congestive heart failure) (HCC)   . Hypertension   . Thyroid disease   . TIA (transient ischemic attack)     Patient Active Problem List   Diagnosis Date Noted  . Panic disorder 05/31/2017  . Severe recurrent major depression without psychotic features (HCC) 05/31/2017  . Generalized anxiety disorder 05/11/2017  . Panic attacks 05/11/2017    Past Surgical History:  Procedure Laterality Date  . ABDOMINAL HYSTERECTOMY    . BACK SURGERY    . EYE SURGERY      Allergies Macrobid [nitrofurantoin macrocrystal]; Oxycodone; and Xanax [alprazolam]  Social History Social History   Tobacco Use  . Smoking status: Former Games developermoker  . Smokeless tobacco: Never Used  Substance Use Topics  . Alcohol use: Not Currently  . Drug use: Never   Review of Systems Constitutional: Negative for fever. Cardiovascular: Negative for chest pain. Respiratory: Negative for shortness of breath. Gastrointestinal: Negative for abdominal pain, vomiting and diarrhea. Musculoskeletal: Negative for back pain. Skin: Negative for rash. Neurological: Negative for headaches, focal weakness or numbness. Psychiatric: Positive for  anxiety  All systems negative/normal/unremarkable except as stated in the HPI  ____________________________________________   PHYSICAL EXAM:  VITAL SIGNS: ED Triage Vitals  Enc Vitals Group     BP      Pulse      Resp      Temp      Temp src      SpO2      Weight      Height      Head Circumference      Peak Flow      Pain Score      Pain Loc      Pain Edu?      Excl. in GC?    Constitutional: Alert and oriented. Well appearing and in no distress. Eyes: Conjunctivae are normal. Normal extraocular movements. Cardiovascular: Normal rate, regular rhythm. No murmurs, rubs, or gallops. Respiratory: Normal respiratory effort without tachypnea nor retractions. Breath sounds are clear and equal bilaterally. No wheezes/rales/rhonchi. Gastrointestinal: Soft and nontender. Normal bowel sounds Musculoskeletal: Nontender with normal range of motion in extremities. No lower extremity tenderness nor edema. Neurologic:  Normal speech and language. No gross focal neurologic deficits are appreciated.  Skin:  Skin is warm, dry and intact. No rash noted. Psychiatric: Mildly anxious, no distress ____________________________________________  ED COURSE:  As part of my medical decision making, I reviewed the following data within the electronic MEDICAL RECORD NUMBER History obtained from family if available, nursing notes, old chart and ekg, as well as notes from prior ED visits. Patient presented for anxiety which has been an ongoing problem for, we will assess with labs as indicated at this time.  Procedures ____________________________________________   LABS (pertinent positives/negatives)  Labs Reviewed  CBC WITH DIFFERENTIAL/PLATELET - Abnormal; Notable for the following components:      Result Value   RDW 15.7 (*)    All other components within normal limits  COMPREHENSIVE METABOLIC PANEL - Abnormal; Notable for the following components:   BUN 24 (*)    Calcium 8.8 (*)    GFR calc  non Af Amer 51 (*)    GFR calc Af Amer 59 (*)    All other components within normal limits  URINALYSIS, COMPLETE (UACMP) WITH MICROSCOPIC - Abnormal; Notable for the following components:   Color, Urine COLORLESS (*)    APPearance CLEAR (*)    All other components within normal limits  TROPONIN I  ____________________________________________  DIFFERENTIAL DIAGNOSIS   Anxiety, depression, dementia  FINAL ASSESSMENT AND PLAN  Anxiety   Plan: The patient had presented for persistent symptoms of anxiety. Patient's labs do not reveal or indicate any acute process although she does need to drink more fluids.  She appears medically stable for outpatient follow-up and medications as prescribed.   Ulice Dash, MD   Note: This note was generated in part or whole with voice recognition software. Voice recognition is usually quite accurate but there are transcription errors that can and very often do occur. I apologize for any typographical errors that were not detected and corrected.     Emily Filbert, MD 07/06/17 1135

## 2017-07-06 NOTE — Telephone Encounter (Signed)
PLEASE ASK THEM TO COME AND COLLECT A COPY OF RESULTS IF THEY WANT IT TODAY. THEY WILL HAVE TO SCHEDULE AN APPOINTMENT TO DISCUSS RESULTS. LIKE I HAVE DISCUSSED PREVIOUSLY MEDICATIONS NEEDS TIME TO WORK AND IF SHE IS A PLACEMENT ISSUE, THEN PLEASE ASK THEM TO SCHEDULE APPOINTMENT WITH SOCIAL WORKER HERE .

## 2017-07-06 NOTE — Telephone Encounter (Signed)
pt daughter states that she keeps calling ems .  pt at [facility] today and they sent her home.  states that they want results today and that something needs to be done before she get kicked out of where she staying.

## 2017-07-06 NOTE — Telephone Encounter (Signed)
genesight results will be discussed during the next appointment .

## 2017-07-06 NOTE — ED Triage Notes (Signed)
Pt [facility] em[facility] that pt has had decreased intake and appears anxious

## 2017-07-06 NOTE — ED Notes (Signed)
Pt constantly calling out and ringing call bell - she repeatedly ask for someone to stay in the room with her and states she does not [facility] because she will have to stay in a room by herself - pt is severely anxious and keeps repeating the same statements

## 2017-07-06 NOTE — Telephone Encounter (Signed)
RETURNED CALL TO PATIENT'S SON TIMOTHY. Left message.

## 2017-07-06 NOTE — ED Notes (Signed)
Pt climbed out of bed and was calling out while walking to the door with equipment attached - Greg RN assisted pt back to bed and spoke with Dr Mayford KnifeWilliams obtaining order for 1:1 sitter

## 2017-07-06 NOTE — Telephone Encounter (Signed)
Called patient's son back - left voicemail.

## 2017-07-08 ENCOUNTER — Encounter: Payer: Self-pay | Admitting: Psychiatry

## 2017-07-08 ENCOUNTER — Ambulatory Visit (INDEPENDENT_AMBULATORY_CARE_PROVIDER_SITE_OTHER): Payer: Medicare Other | Admitting: Psychiatry

## 2017-07-08 ENCOUNTER — Other Ambulatory Visit: Payer: Self-pay

## 2017-07-08 VITALS — BP 101/52 | HR 101 | Temp 98.0°F | Wt 103.2 lb

## 2017-07-08 DIAGNOSIS — F411 Generalized anxiety disorder: Secondary | ICD-10-CM

## 2017-07-08 DIAGNOSIS — F410 Panic disorder [episodic paroxysmal anxiety] without agoraphobia: Secondary | ICD-10-CM | POA: Diagnosis not present

## 2017-07-08 DIAGNOSIS — F09 Unspecified mental disorder due to known physiological condition: Secondary | ICD-10-CM | POA: Diagnosis not present

## 2017-07-08 DIAGNOSIS — F5105 Insomnia due to other mental disorder: Secondary | ICD-10-CM

## 2017-07-08 MED ORDER — VENLAFAXINE HCL ER 37.5 MG PO CP24: 37.5000 mg | ORAL_CAPSULE | Freq: Every day | ORAL | 0 refills | Status: DC

## 2017-07-08 MED ORDER — BUSPIRONE HCL 15 MG PO TABS: 15.0000 mg | ORAL_TABLET | Freq: Two times a day (BID) | ORAL | 0 refills | Status: AC

## 2017-07-08 MED ORDER — ESCITALOPRAM OXALATE 20 MG PO TABS: 10.0000 mg | ORAL_TABLET | Freq: Every day | ORAL | 0 refills | Status: DC

## 2017-07-08 MED ORDER — MIRTAZAPINE 15 MG PO TABS: 15.0000 mg | ORAL_TABLET | Freq: Every day | ORAL | 0 refills | Status: AC
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
## 2017-07-08 NOTE — Patient Instructions (Addendum)
Venlafaxine extended-release capsules  What is this medicine?  VENLAFAXINE(VEN la fax een) is used to treat depression, anxiety and panic disorder.  This medicine may be used for other purposes; ask your health care provider or pharmacist if you have questions.  COMMON BRAND NAME(S): Effexor XR  What should I tell my health care provider before I take this medicine?  They need to know if you have any of these conditions:  -bleeding disorders  -glaucoma  -heart disease  -high blood pressure  -high cholesterol  -kidney disease  -liver disease  -low levels of sodium in the blood  -mania or bipolar disorder  -seizures  -suicidal thoughts, plans, or attempt; a previous suicide attempt by you or a family  -take medicines that treat or prevent blood clots  -thyroid disease  -an unusual or allergic reaction to venlafaxine, desvenlafaxine, other medicines, foods, dyes, or preservatives  -pregnant or trying to get pregnant  -breast-feeding  How should I use this medicine?  Take this medicine by mouth with a full glass of water. Follow the directions on the prescription label. Do not cut, crush, or chew this medicine. Take it with food. If needed, the capsule may be carefully opened and the entire contents sprinkled on a spoonful of cool applesauce. Swallow the applesauce/pellet mixture right away without chewing and follow with a glass of water to ensure complete swallowing of the pellets. Try to take your medicine at about the same time each day. Do not take your medicine more often than directed. Do not stop taking this medicine suddenly except upon the advice of your doctor. Stopping this medicine too quickly may cause serious side effects or your condition may worsen.  A special MedGuide will be given to you by the pharmacist with each prescription and refill. Be sure to read this information carefully each time.  Talk to your pediatrician regarding the use of this medicine in children. Special care may be  needed.  Overdosage: If you think you have taken too much of this medicine contact a poison control center or emergency room at once.  NOTE: This medicine is only for you. Do not share this medicine with others.  What if I miss a dose?  If you miss a dose, take it as soon as you can. If it is almost time for your next dose, take only that dose. Do not take double or extra doses.  What may interact with this medicine?  Do not take this medicine with any of the following medications:  -certain medicines for fungal infections like fluconazole, itraconazole, ketoconazole, posaconazole, voriconazole  -cisapride  -desvenlafaxine  -dofetilide  -dronedarone  -duloxetine  -levomilnacipran  -linezolid  -MAOIs like Carbex, Eldepryl, Marplan, Nardil, and Parnate  -methylene blue (injected into a vein)  -milnacipran  -pimozide  -thioridazine  -ziprasidone  This medicine may also interact with the following medications:  -amphetamines  -aspirin and aspirin-like medicines  -certain medicines for depression, anxiety, or psychotic disturbances  -certain medicines for migraine headaches like almotriptan, eletriptan, frovatriptan, naratriptan, rizatriptan, sumatriptan, zolmitriptan  -certain medicines for sleep  -certain medicines that treat or prevent blood clots like dalteparin, enoxaparin, warfarin  -cimetidine  -clozapine  -diuretics  -fentanyl  -furazolidone  -indinavir  -isoniazid  -lithium  -metoprolol  -NSAIDS, medicines for pain and inflammation, like ibuprofen or naproxen  -other medicines that prolong the QT interval (cause an abnormal heart rhythm)  -procarbazine  -rasagiline  -supplements like St. John's wort, kava kava, valerian  -tramadol  -tryptophan    worse. Visit your doctor or health care professional for regular checks on your progress. Because it may take several weeks to see the full effects of this medicine, it is important to continue your treatment as prescribed by your doctor. Patients and their families should watch out for new or worsening thoughts of suicide or depression. Also watch out for sudden changes in feelings such as feeling anxious, agitated, panicky, irritable, hostile, aggressive, impulsive, severely restless, overly excited and hyperactive, or not being able to sleep. If this happens, especially at the beginning of treatment or after a change in dose, call your health care professional. This medicine can cause an increase in blood pressure. Check with your doctor for instructions on monitoring your blood pressure while taking this medicine. You may get drowsy or dizzy. Do not drive, use machinery, or do anything that needs mental alertness until you know how this medicine affects you. Do not stand or sit up quickly, especially if you are an older patient. This reduces the risk of dizzy or fainting spells. Alcohol may interfere with the effect of this medicine. Avoid alcoholic drinks. Your mouth may get dry. Chewing sugarless gum, sucking hard candy and drinking plenty of water will help. Contact your doctor if the problem does not go away or is severe. What side effects may I notice from receiving this medicine? Side effects that you should report to your doctor or health care professional as soon as possible: -allergic reactions like skin rash, itching or hives, swelling of the face, lips, or tongue -anxious -breathing problems -confusion -changes in vision -chest pain -confusion -elevated mood, decreased need for sleep, racing thoughts, impulsive behavior -eye pain -fast, irregular  heartbeat -feeling faint or lightheaded, falls -feeling agitated, angry, or irritable -hallucination, loss of contact with reality -high blood pressure -loss of balance or coordination -palpitations -redness, blistering, peeling or loosening of the skin, including inside the mouth -restlessness, pacing, inability to keep still -seizures -stiff muscles -suicidal thoughts or other mood changes -trouble passing urine or change in the amount of urine -trouble sleeping -unusual bleeding or bruising -unusually weak or tired -vomiting Side effects that usually do not require medical attention (report to your doctor or health care professional if they continue or are bothersome): -change in sex drive or performance -change in appetite or weight -constipation -dizziness -dry mouth -headache -increased sweating -nausea -tired This list may not describe all possible side effects. Call your doctor for medical advice about side effects. You may report side effects to FDA at 1-800-FDA-1088. Where should I keep my medicine? Keep out of the reach of children. Store at a controlled temperature between 20 and 25 degrees C (68 degrees and 77 degrees F), in a dry place. Throw away any unused medicine after the expiration date. NOTE: This sheet is a summary. It may not cover all possible information. If you have questions about this medicine, talk to your doctor, pharmacist, or health care provider.  2018 Elsevier/Gold Standard (2015-05-30 18:38:02)  Lexapro to be weaned off. Start taking 10 mg ( half tablet ) for 5 days and stop.

## 2017-07-09 ENCOUNTER — Encounter: Payer: Self-pay | Admitting: Psychiatry

## 2017-07-16 ENCOUNTER — Encounter: Payer: Self-pay | Admitting: Psychiatry

## 2017-07-16 ENCOUNTER — Ambulatory Visit (INDEPENDENT_AMBULATORY_CARE_PROVIDER_SITE_OTHER): Payer: Medicare Other | Admitting: Psychiatry

## 2017-07-16 ENCOUNTER — Other Ambulatory Visit: Payer: Self-pay

## 2017-07-16 VITALS — BP 115/74 | HR 92 | Temp 97.4°F | Wt 98.4 lb

## 2017-07-16 DIAGNOSIS — F411 Generalized anxiety disorder: Secondary | ICD-10-CM

## 2017-07-16 DIAGNOSIS — F09 Unspecified mental disorder due to known physiological condition: Secondary | ICD-10-CM

## 2017-07-16 DIAGNOSIS — F5105 Insomnia due to other mental disorder: Secondary | ICD-10-CM | POA: Diagnosis not present

## 2017-07-16 DIAGNOSIS — F410 Panic disorder [episodic paroxysmal anxiety] without agoraphobia: Secondary | ICD-10-CM

## 2017-07-16 MED ORDER — VENLAFAXINE HCL ER 37.5 MG PO CP24
37.5000 mg | ORAL_CAPSULE | Freq: Every day | ORAL | 1 refills | Status: AC
Start: 2017-07-16 — End: ?

## 2017-07-16 NOTE — Patient Instructions (Signed)
[facility] at 2:30 PM
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
## 2017-07-18 NOTE — Progress Notes (Signed)
Comprehensive Clinical Assessment (CCA) Note  06/25/2017 Lydia Buchanan [id number]  Visit Diagnosis:      ICD-10-CM   1. Panic disorder F41.0   2. Generalized anxiety disorder F41.1   3. Insomnia due to mental condition F51.05       CCA Part One  Part One has been completed on paper by the patient.  (See scanned document in Chart Review)  CCA Part Two A  Intake/Chief Complaint:  CCA Intake With Chief Complaint CCA Part Two Date: 06/25/17 CCA Part Two Time: 0800 Chief Complaint/Presenting Problem: I have been having problems wiht anxiety all my life.  SInce my husband died and my kids placed me in an independent living things have been going down hill. Patients Currently Reported Symptoms/Problems: I do not like being alone.  I have a room to myself and I hate it.  I like to be around others when I am not I have band thoughts.  I get anxious, overwhelmed, unable to focus on stay on task.  I do beeter when i have things to do.  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  I have taken medication most of my life to manage my symptoms. After husband death she did live alone and was able to volunteer with no significant symptoms.  Patient has had Psychiatry in the past; in various states ([place], [place], [place] & [place]).  Patient has contacted 911 on several occassions to go to the ER.  Patient will state chest pain.  On a few occassions paramedics were able to talk her out of going to ER.  Patient currently has someone who stays with her at night.  When her aid leaves she will go to the common area and stay until her aid comes back at night.  Patient has been asked to leave the facility due to her frequent calls (911).  Possibly will live with her son, Jorja Loa.   Individual's Strengths: loving family, helpful Individual's Preferences: to live with others, to have someone to talk to, to stay busy, reduce symptoms Individual's Abilities: commnicates, motivated Type of Services Patient  Feels Are Needed: therapy, medication  Mental Health Symptoms Depression:  Depression: N/A  Mania:  Mania: N/A  Anxiety:   Anxiety: Worrying, Tension, Sleep, Restlessness, Irritability, Fatigue, Difficulty concentrating  Psychosis:  Psychosis: N/A  Trauma:  Trauma: N/A  Obsessions:  Obsessions: N/A  Compulsions:  Compulsions: N/A  Inattention:  Inattention: N/A  Hyperactivity/Impulsivity:  Hyperactivity/Impulsivity: N/A  Oppositional/Defiant Behaviors:  Oppositional/Defiant Behaviors: N/A  Borderline Personality:  Emotional Irregularity: N/A  Other Mood/Personality Symptoms:      Mental Status Exam Appearance and self-care  Stature:  Stature: (appears to average; in wheelchair)  Weight:  Weight: Thin  Clothing:  Clothing: Neat/clean  Grooming:  Grooming: Normal  Cosmetic use:  Cosmetic Use: Age appropriate  Posture/gait:  Posture/Gait: Normal  Motor activity:  Motor Activity: Not Remarkable  Sensorium  Attention:  Attention: Normal  Concentration:  Concentration: Normal  Orientation:  Orientation: X5  Recall/memory:  Recall/Memory: Normal  Affect and Mood  Affect:  Affect: Appropriate  Mood:  Mood: Anxious  Relating  Eye contact:  Eye Contact: Normal  Facial expression:     Attitude toward examiner:  Attitude Toward Examiner: Cooperative  Thought and Language  Speech flow: Speech Flow: Normal  Thought content:  Thought Content: Appropriate to mood and circumstances  Preoccupation:     Hallucinations:     Organization:     Art therapist  Fund of Knowledge:  Fund of Knowledge: Average  Intelligence:  Intelligence: Average  Abstraction:  Abstraction: Normal  Judgement:  Judgement: Fair  Dance movement psychotherapisteality Testing:  Reality Testing: Adequate  Insight:  Insight: Good, Fair  Decision Making:  Decision Making: Normal  Social Functioning  Social Maturity:     Social Judgement:     Stress  Stressors:  Stressors: Transitions  Coping Ability:  Coping Ability: Human resources officerverwhelmed   Skill Deficits:     Supports:      Family and Psychosocial History: Family history Marital status: Widowed Widowed, when?: 2009 Are you sexually active?: No What is your sexual orientation?: heterosexual Does patient have children?: Yes How many children?: 2 How is patient's relationship with their children?: Has a close relationship with her children.  Her son Jorja Loaim, was at the appointment with her  Childhood History:  Childhood History By whom was/is the patient raised?: Both parents Description of patient's relationship with caregiver when they were a child: Mother: great relationship Father: great relationship Patient's description of current relationship with people who raised him/her: both parents deceased How were you disciplined when you got in trouble as a child/adolescent?: talking to, grounded Does patient have siblings?: Yes Number of Siblings: 413 Description of patient's current relationship with siblings: Has a good relationhip.  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, 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 Where was the patient employed at that time?: over 15 years Did You Receive Any Psychiatric Treatment/Services While in Equities traderthe Military?: No  Education: Education Did Garment/textile technologistYou Graduate From [employer] School?: (P) Yes Did You Attend College?: (P) No Did You Attend Graduate School?: (P) No  Religion: Religion/Spirituality Are You A Religious Person?: (P)  No  Leisure/Recreation:    Exercise/Diet:    CCA Part Two C  Alcohol/Drug Use: Alcohol / Drug Use Pain Medications: denies Prescriptions: remeron, buspar, lexapro Over the Counter: denies History of alcohol / drug use?: No history of alcohol / drug abuse                      CCA Part Three  ASAM's:  Six Dimensions of Multidimensional Assessment  Dimension 1:  Acute Intoxication and/or Withdrawal Potential:     Dimension 2:  Biomedical Conditions and Complications:     Dimension 3:  Emotional, Behavioral, or Cognitive Conditions and Complications:     Dimension 4:  Readiness to Change:     Dimension 5:  Relapse, Continued use, or Continued Problem Potential:     Dimension 6:  Recovery/Living Environment:      Substance use Disorder (SUD)    Social Function:     Stress:  Stress Stressors: Transitions Coping Ability: Overwhelmed Patient Takes Medications The Way The Doctor Instructed?: Yes Priority Risk: Low Acuity  Risk Assessment- Self-Harm Potential: Risk Assessment For Self-Harm Potential Thoughts of Self-Harm: No current thoughts Method: No plan Availability of Means: No access/NA  Risk Assessment -Dangerous to Others Potential: Risk Assessment For Dangerous to Others Potential Method: No Plan  Availability of Means: No access or NA Intent: Vague intent or NA Notification Required: No need or identified person  DSM5 Diagnoses: Patient Active Problem List   Diagnosis Date Noted  . Panic disorder 05/31/2017  . Severe recurrent major depression without psychotic features (HCC) 05/31/2017  . Generalized anxiety disorder 05/11/2017  . Panic attacks 05/11/2017  . Anticoagulation goal of INR 2 to 3 04/26/2014  . Atrial fibrillation (HCC) 04/26/2014  . Essential hypertension 04/26/2014  . History of TIAs 04/26/2014  . Hypothyroidism due to acquired atrophy of thyroid 04/26/2014  . Sleep disturbance 04/26/2014    Patient Centered Plan: Patient is  on the following Treatment Plan(s):  Anxiety  Recommendations for Services/Supports/Treatments: Recommendations for Services/Supports/Treatments Recommendations For Services/Supports/Treatments: IOP (Intensive Outpatient Program), Medication Management  Treatment Plan Summary:    Referrals to Alternative Service(s): Referred to Alternative Service(s):   Place:   Date:   Time:    Referred to Alternative Service(s):   Place:   Date:   Time:    Referred to Alternative Service(s):   Place:   Date:   Time:    Referred to Alternative Service(s):   Place:   Date:   Time:     Marinda Elk

## 2017-07-22 ENCOUNTER — Telehealth: Payer: Self-pay

## 2017-07-22 NOTE — Telephone Encounter (Signed)
Medication management - Met with Dr. Doyne Keel to discuss patient's started order for Effexor XR 37.5 mg and this was turned down due to 2 a day.  Infomred not sure if pt has taken any of this already so how best to titrate up if needs to start at 37.5 mg.  Informed patient Dr. Doyne Keel would approve a one week supply of 37.5 mg, #7 one a day and then an increase to 75 mg, one a day, #30 if this is what patient would like to do but requested patient call back to discuss to ensure she does not take both at the same time and understands all instructions.  Orders can be sent in once discussed with patient to ensure she is aware of how to start and requested she call the office back in [place] or [place] to discuss providers plans in the absence of Dr. Shea Evans at this time.

## 2017-07-22 NOTE — Telephone Encounter (Signed)
received a fax requesting a new rx because patient insurance will not pay for 2 capsules daily of venlafaxine 47.5mg   they need a new rx for 75mg .   venlafaxine XR (EFFEXOR-XR) 37.5 MG 24 hr capsule  Medication  Date: 07/16/2017 Department: [facility] Ordering/Authorizing: Jomarie LongsEappen, Saramma, MD  Order Providers   Prescribing Provider Encounter Provider  Jomarie LongsEappen, Saramma, MD Jomarie LongsEappen, Saramma, MD  Outpatient Medication Detail    Disp Refills Start End   venlafaxine XR (EFFEXOR-XR) 37.5 MG 24 hr capsule 60 capsule 1 07/16/2017    Sig - Route: Take 1-2 capsules (37.5-75 mg total) by mouth daily with breakfast. Please start taking 75 mg ( 2 capsules) in 1 week . - Oral   Sent to pharmacy as: venlafaxine XR (EFFEXOR-XR) 37.5 MG 24 hr capsule   E-Prescribing Status: Receipt confirmed by pharmacy (07/16/2017 11:48 AM EDT)

## 2017-08-04 ENCOUNTER — Other Ambulatory Visit: Payer: Self-pay | Admitting: Psychiatry

## 2017-08-05 ENCOUNTER — Ambulatory Visit (HOSPITAL_COMMUNITY): Payer: Medicare Other | Admitting: Psychiatry

## 2017-08-20 ENCOUNTER — Telehealth: Payer: Self-pay | Admitting: Psychiatry

## 2017-08-20 NOTE — Telephone Encounter (Signed)
Returned call to Lydia Buchanan at [phone] phone But Lydia Buchanan patient after her visit with writer the beginning of July developed UTI and was [facility].  Patient was discharged from there and then developed a seizure-like spell and was found to have a stroke.  Patient was admitted to the hospital at [facility] at that time.  Patient later on was released to [facility] where she stayed until August 5 and was just released home a few days ago. Patient currently follows up with PMD as well as neurologist [facility] Renata [facility]onrad [facility] who has made a lot of medication changes.  Per son she needs 24/7 care and has all these doctor's appointments to keep up with and hence they have canceled the appointment to come here sometime next week.  For now they will follow up with PMD as well as neurology and will follow-up with writer if needed in the future.

## 2017-08-24 ENCOUNTER — Ambulatory Visit: Payer: Medicare Other | Admitting: Psychiatry

## 2017-10-10 ENCOUNTER — Other Ambulatory Visit: Payer: Self-pay | Admitting: Psychiatry

## 2019-03-13 DEATH — deceased

## 2019-10-27 IMAGING — US US EXTREM LOW VENOUS BILAT
1 series · 13 of 24 positions shown · non-contrast
Comparison: None.

CLINICAL DATA: Bilateral leg pain with ulcers x1 month.



[Series 1: us extrem low venous bilat · 13 of 67 slices shown]
[im 1/67]
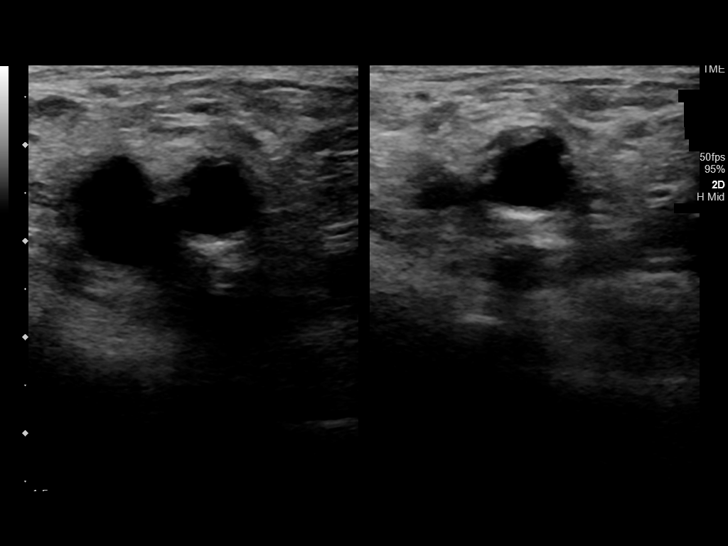
[im 6/67]
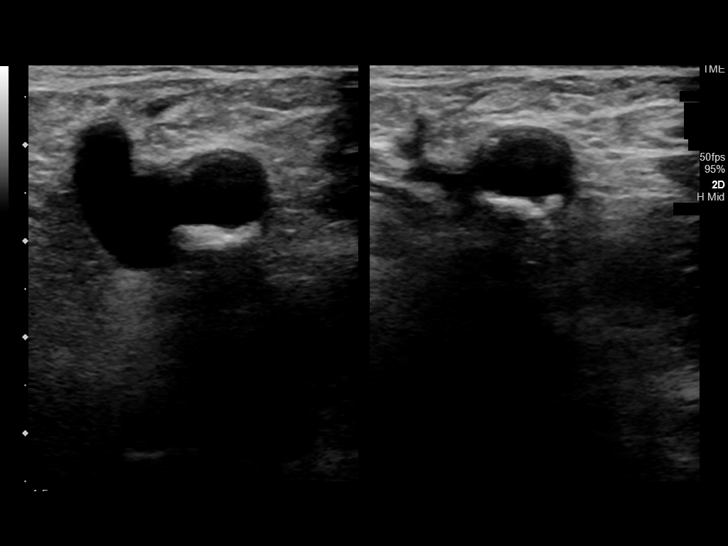
[im 12/67]
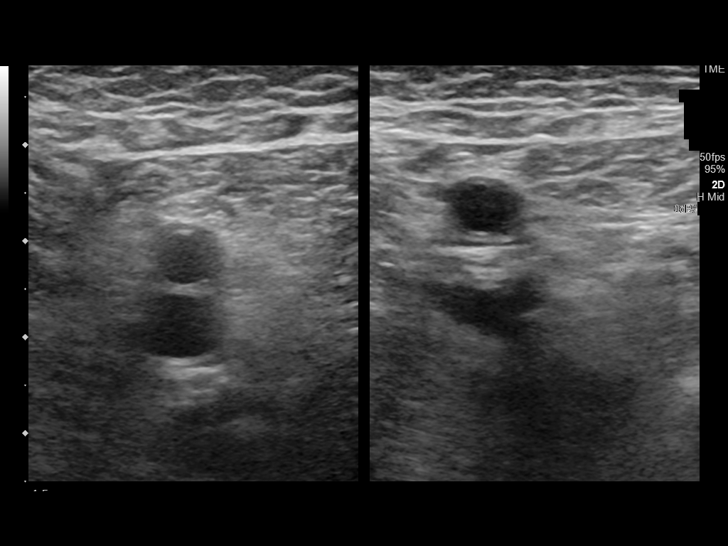
[im 18/67]
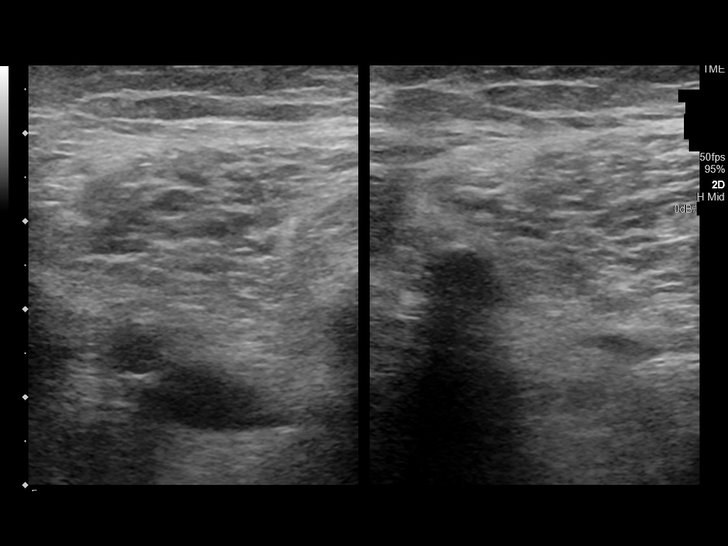
[im 23/67]
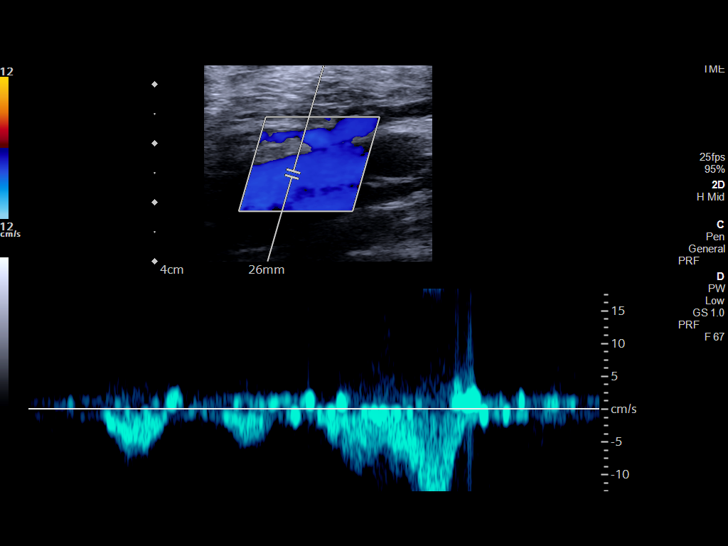
[im 29/67]
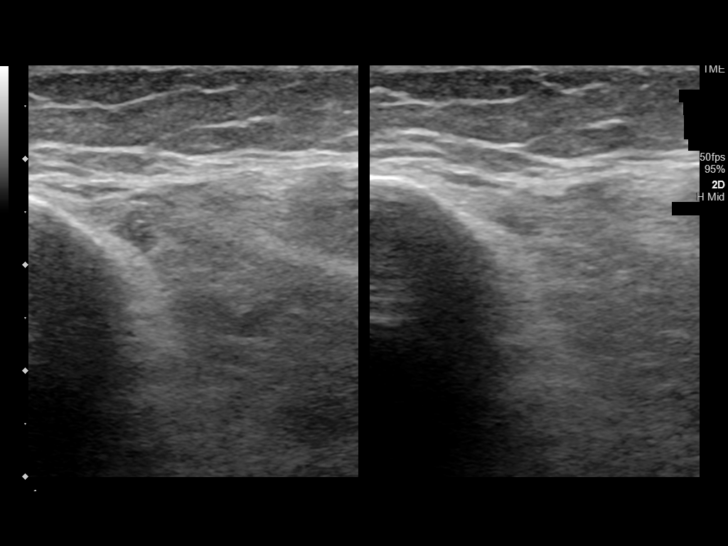
[im 35/67]
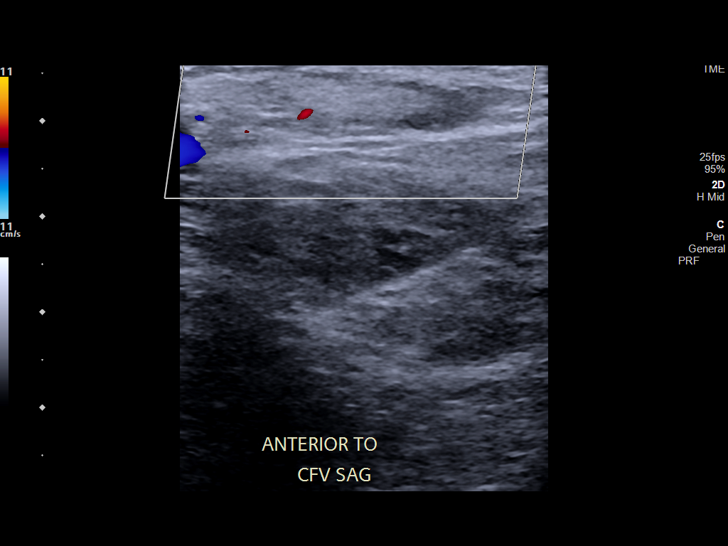
[im 38/67]
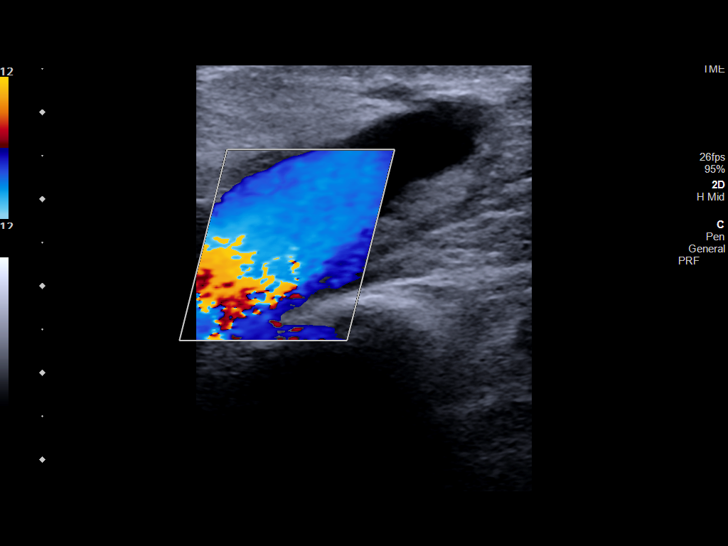
[im 44/67]
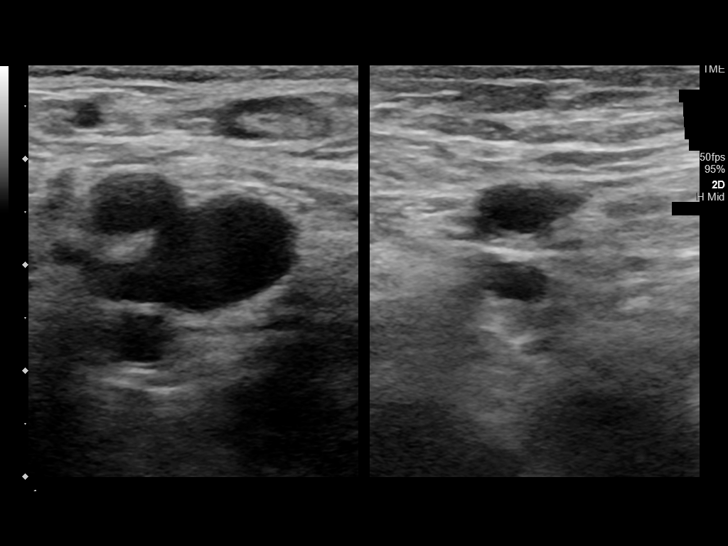
[im 49/67]
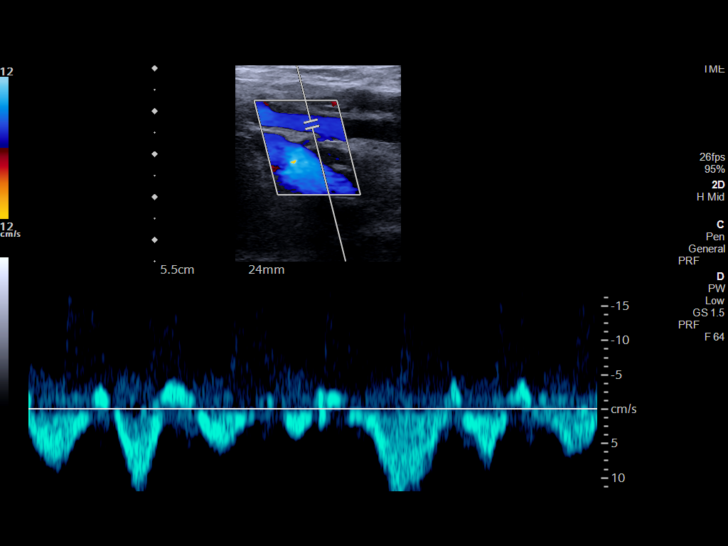
[im 55/67]
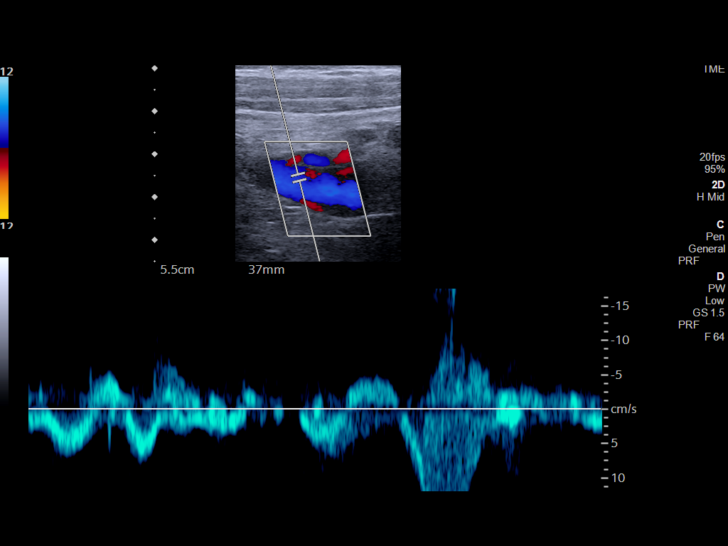
[im 61/67]
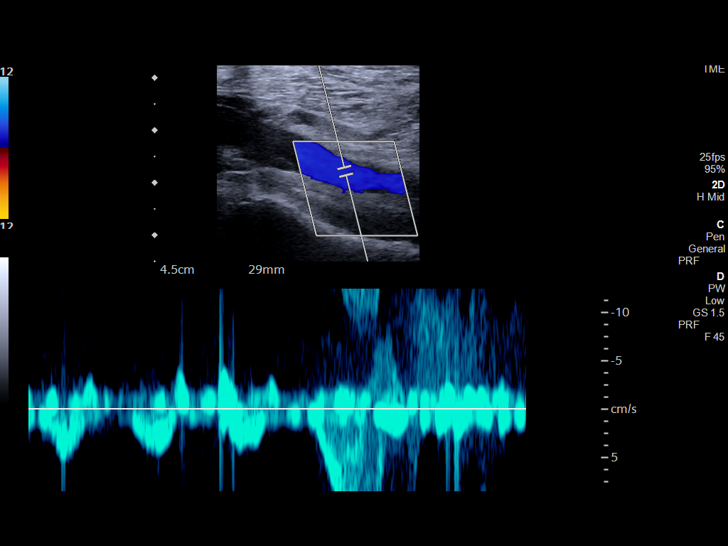
[im 67/67]
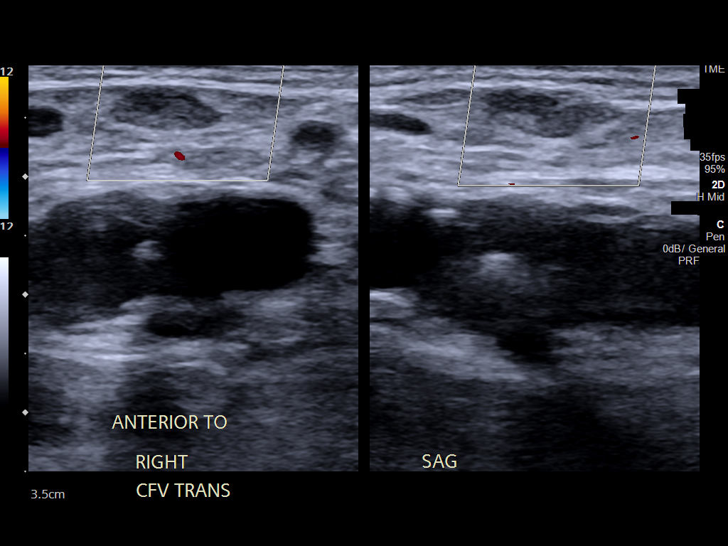

[13 of 24 positions shown; findings below may reference images not displayed]

FINDINGS: RIGHT LOWER EXTREMITY

Common Femoral Vein: No evidence of thrombus. Normal
compressibility, respiratory phasicity and response to augmentation.

Saphenofemoral Junction: No evidence of thrombus. Normal
compressibility and flow on color Doppler imaging.

Profunda Femoral Vein: No evidence of thrombus. Normal
compressibility and flow on color Doppler imaging.

Femoral Vein: No evidence of thrombus. Normal compressibility,
respiratory phasicity and response to augmentation.

Popliteal Vein: No evidence of thrombus. Normal compressibility,
respiratory phasicity and response to augmentation.

Calf Veins: No evidence of thrombus. Normal compressibility and flow
on color Doppler imaging.

Superficial Great Saphenous Vein: No evidence of thrombus. Normal
compressibility.

Venous Reflux:  None.

Other Findings:  None.

LEFT LOWER EXTREMITY

Common Femoral Vein: No evidence of thrombus. Normal
compressibility, respiratory phasicity and response to augmentation.

Saphenofemoral Junction: No evidence of thrombus. Normal
compressibility and flow on color Doppler imaging.

Profunda Femoral Vein: No evidence of thrombus. Normal
compressibility and flow on color Doppler imaging.

Femoral Vein: No evidence of thrombus. Normal compressibility,
respiratory phasicity and response to augmentation.

Popliteal Vein: No evidence of thrombus. Normal compressibility,
respiratory phasicity and response to augmentation.

Calf Veins: No evidence of thrombus. Normal compressibility and flow
on color Doppler imaging.

Superficial Great Saphenous Vein: No evidence of thrombus. Normal
compressibility.

Venous Reflux:  None.

Other Findings:  None.
IMPRESSION: No evidence of deep venous thrombosis.

## 2019-10-29 IMAGING — CR DG CHEST 2V
1 series · 2 of 2 positions shown · non-contrast
Comparison: None.

CLINICAL DATA: Shortness of breath

EXAM:
CHEST - 2 VIEW

[Series 1: dg chest 2 view · 0.14mm/px · 2 of 2 slices shown]
[im 1/2]
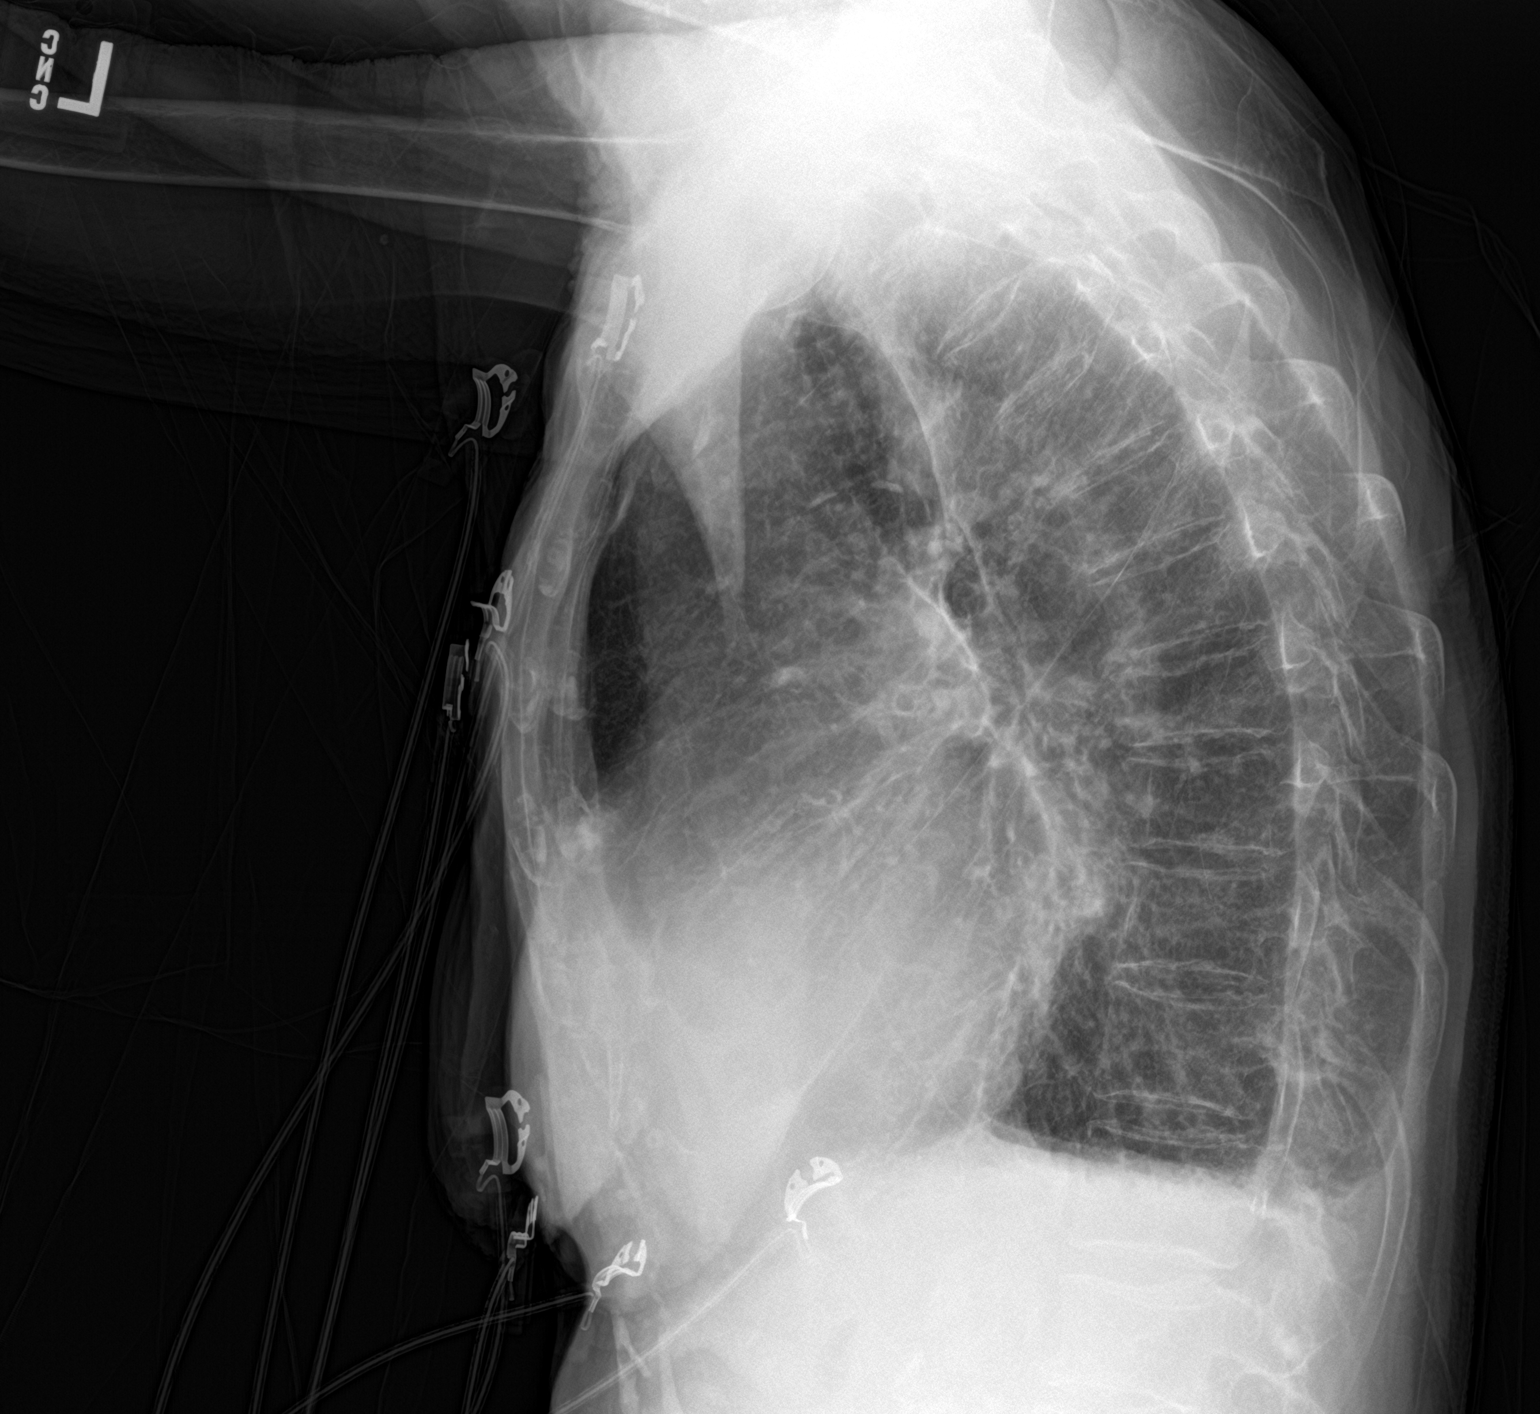
[im 2/2]
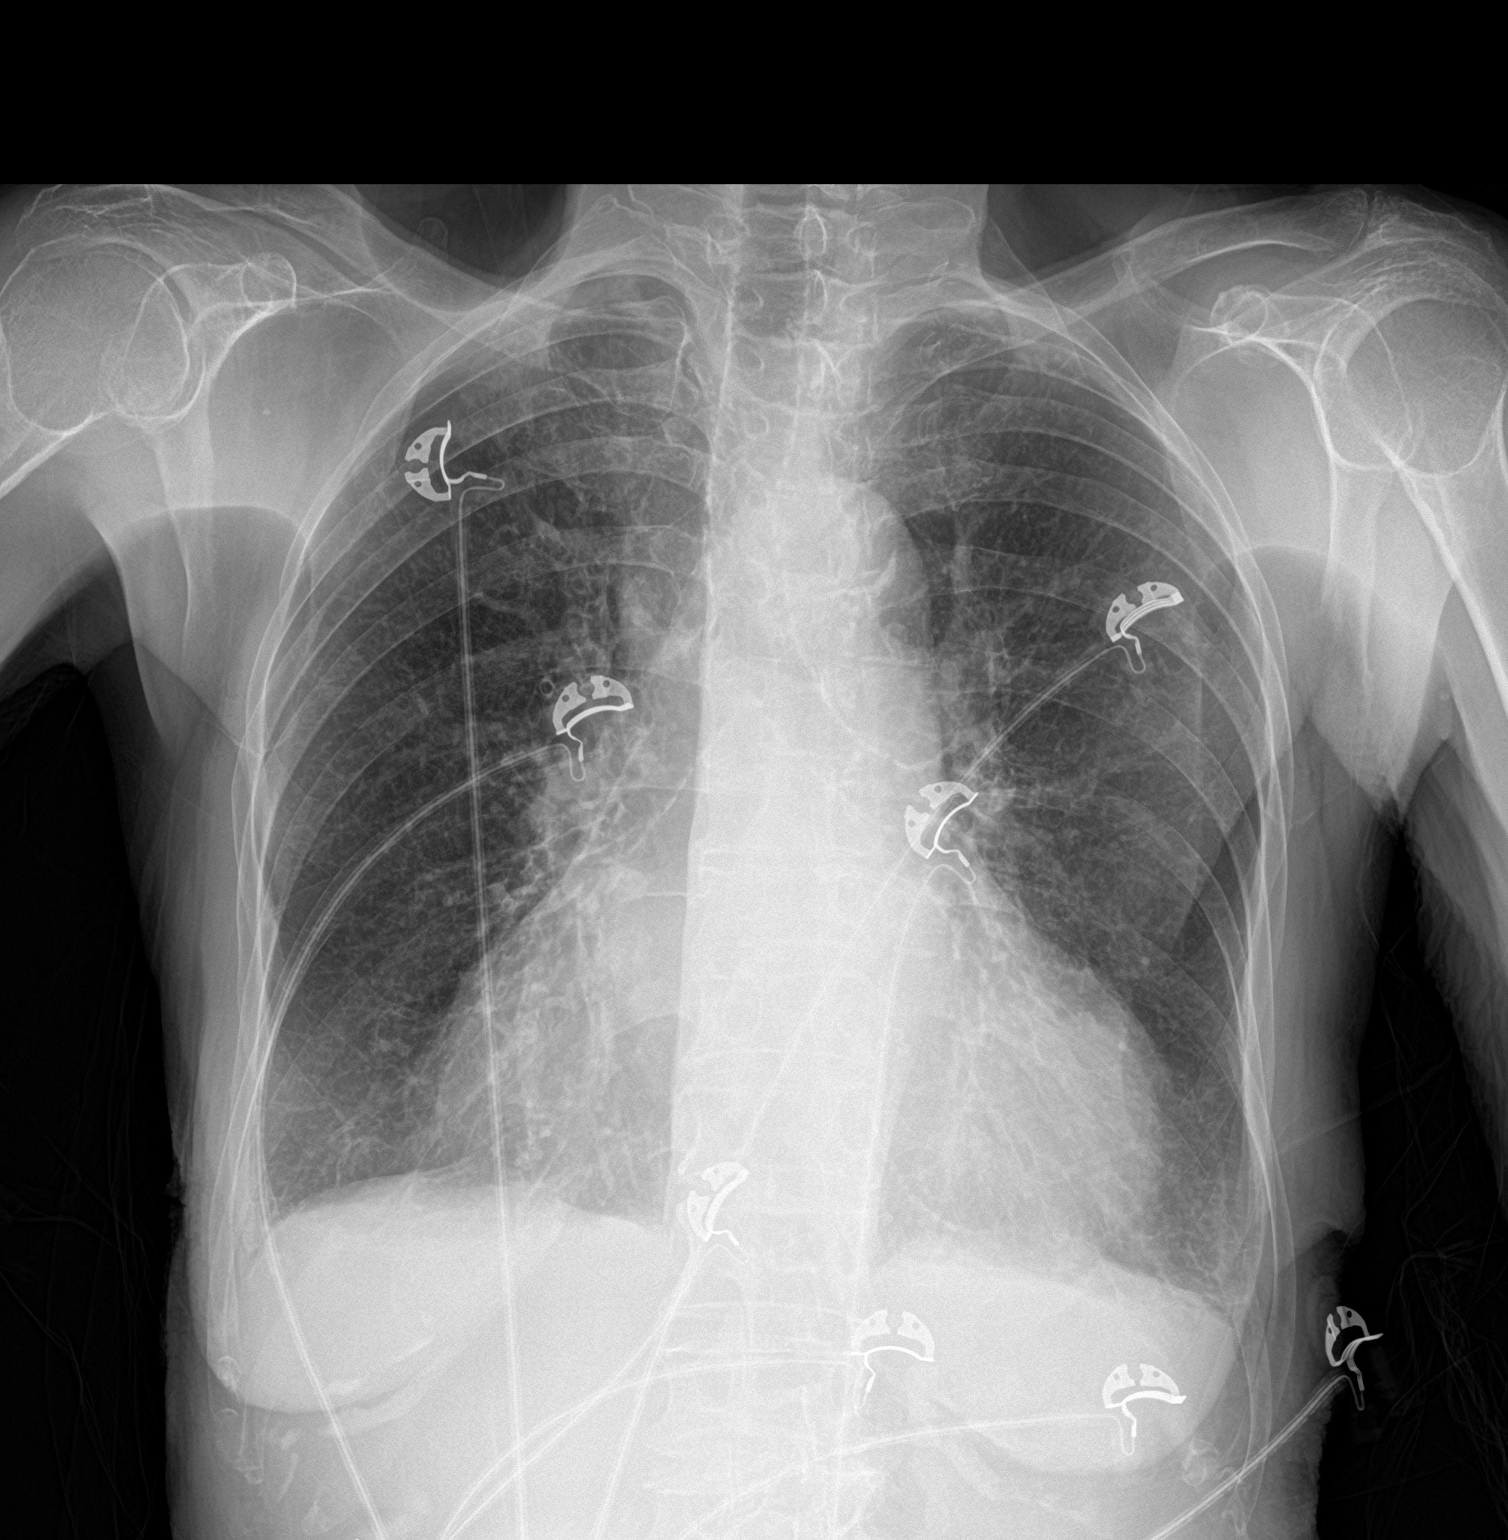

[2 of 2 positions shown; findings below may reference images not displayed]

FINDINGS: Lungs are hyperexpanded. There is slight scarring in the bases.
There is no edema or consolidation. There is cardiomegaly with
pulmonary vascularity within normal limits. There is aortic
atherosclerosis. No adenopathy. There is degenerative change in the
thoracic spine.
IMPRESSION: Cardiomegaly.  Aortic atherosclerosis.

Lungs hyperexpanded with mild bibasilar scarring. No edema or
consolidation evident.

Aortic Atherosclerosis ([id number]).
